# Patient Record
Sex: Male | Born: 1990 | Race: White | Hispanic: No | Marital: Single | Smoking: Current every day smoker
Health system: Southern US, Community
[De-identification: ages and names within clinical notes are randomized; demographics above are authoritative.]

## PROBLEM LIST (undated history)

## (undated) DIAGNOSIS — F909 Attention-deficit hyperactivity disorder, unspecified type: Secondary | ICD-10-CM

## (undated) DIAGNOSIS — R51 Headache: Secondary | ICD-10-CM

## (undated) DIAGNOSIS — B1920 Unspecified viral hepatitis C without hepatic coma: Secondary | ICD-10-CM

## (undated) DIAGNOSIS — T7840XA Allergy, unspecified, initial encounter: Secondary | ICD-10-CM

## (undated) DIAGNOSIS — F1910 Other psychoactive substance abuse, uncomplicated: Secondary | ICD-10-CM

## (undated) DIAGNOSIS — A4902 Methicillin resistant Staphylococcus aureus infection, unspecified site: Secondary | ICD-10-CM

## (undated) HISTORY — DX: Attention-deficit hyperactivity disorder, unspecified type: F90.9

## (undated) HISTORY — PX: KNEE ARTHROSCOPY: SUR90

## (undated) HISTORY — DX: Allergy, unspecified, initial encounter: T78.40XA

## (undated) HISTORY — PX: MYRINGOTOMY: SUR874

## (undated) HISTORY — DX: Other psychoactive substance abuse, uncomplicated: F19.10

---
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
## 2010-07-06 ENCOUNTER — Encounter: Payer: Self-pay | Admitting: General Surgery

## 2010-09-21 LAB — DIFFERENTIAL
Basophils Absolute: 0 10*3/uL (ref 0.0–0.1)
Basophils Relative: 0 % (ref 0–1)
Eosinophils Absolute: 0.1 10*3/uL (ref 0.0–0.7)
Eosinophils Relative: 1 % (ref 0–5)
Lymphocytes Relative: 32 % (ref 12–46)
Lymphs Abs: 3.1 10*3/uL (ref 0.7–4.0)
Monocytes Absolute: 0.6 10*3/uL (ref 0.1–1.0)
Monocytes Relative: 6 % (ref 3–12)
Neutro Abs: 5.9 10*3/uL (ref 1.7–7.7)
Neutrophils Relative %: 60 % (ref 43–77)

## 2010-09-21 LAB — CBC
HCT: 44.1 % (ref 39.0–52.0)
Hemoglobin: 14.8 g/dL (ref 13.0–17.0)
MCHC: 33.6 g/dL (ref 30.0–36.0)
MCV: 93.3 fL (ref 78.0–100.0)
Platelets: 188 10*3/uL (ref 150–400)
RBC: 4.73 MIL/uL (ref 4.22–5.81)
RDW: 11.9 % (ref 11.5–15.5)
WBC: 9.7 10*3/uL (ref 4.0–10.5)

## 2010-09-21 LAB — URINALYSIS, ROUTINE W REFLEX MICROSCOPIC
Bilirubin Urine: NEGATIVE
Glucose, UA: NEGATIVE mg/dL
Hgb urine dipstick: NEGATIVE
Ketones, ur: NEGATIVE mg/dL
Nitrite: NEGATIVE
Protein, ur: NEGATIVE mg/dL
Specific Gravity, Urine: 1.022 (ref 1.005–1.030)
Urobilinogen, UA: 1 mg/dL (ref 0.0–1.0)
pH: 6 (ref 5.0–8.0)

## 2010-09-21 LAB — BASIC METABOLIC PANEL
BUN: 12 mg/dL (ref 6–23)
CO2: 28 mEq/L (ref 19–32)
Calcium: 9.5 mg/dL (ref 8.4–10.5)
Chloride: 103 mEq/L (ref 96–112)
Creatinine, Ser: 0.83 mg/dL (ref 0.4–1.5)
GFR calc Af Amer: >60 mL/min (ref >60)
GFR calc non Af Amer: >60 mL/min (ref >60)
Glucose, Bld: 100 mg/dL — ABNORMAL HIGH (ref 70–99)
Potassium: 3.8 mEq/L (ref 3.5–5.1)
Sodium: 138 mEq/L (ref 135–145)

## 2010-09-21 LAB — RAPID URINE DRUG SCREEN, HOSP PERFORMED
Amphetamines: NOT DETECTED
Barbiturates: NOT DETECTED
Benzodiazepines: NOT DETECTED
Cocaine: NOT DETECTED
Opiates: NOT DETECTED
Tetrahydrocannabinol: POSITIVE — AB

## 2010-09-21 LAB — ETHANOL: Alcohol, Ethyl (B): <5 mg/dL (ref 0–10)

## 2010-10-28 NOTE — Consult Note (Signed)
NAME:  Bob Vargas, NDIAYE NO.:  [id number]   MEDICAL RECORD NO.:  [id number]          PATIENT TYPE:  IPS   LOCATION:  0502                          FACILITY:  BH   PHYSICIAN:  Acey Lav, MD  DATE OF BIRTH:  22-Aug-1990   DATE OF CONSULTATION:  01/06/2009  DATE OF DISCHARGE:                                 CONSULTATION   REASON FOR INFECTIOUS DISEASE CONSULT:  Concern for scabies.   HISTORY OF PRESENT ILLNESS:  Bob Vargas is an 20 year old Caucasian male  with a past medical history significant for polysubstance abuse  including intravenous heroin, methamphetamine, crack cocaine,  prescription opiates, marijuana, LSD, psychedelic mushrooms.  He was  evaluated in [facility] Room for medical clearance for  admission to psychiatry for detoxification from his Suboxone.  In the  emergency room he endorsed also pruritic lesions between his fingers,  along his waist, and ankles.  The patient was thought to have scabies,  and was given a dose of permethrin in the emergency room.  He was not  placed on contact precautions initially in the emergency room.  He was  given the permethrin lotion, which he applied to entire body, and then  12 hours later washed this off.  He then was brought into the [facility] approximately 17 hours after his initial application of  permethrin.  He is currently staying in a room with another patient in  Behavioral Health who is HIV positive and there has been concern about  the possibility of the patient transmitting scabies to his room mate or  other healthcare workers.   PAST MEDICAL HISTORY:  Polysubstance abuse, as described above.  Tobacco  abuse.   SURGICAL HISTORY:  None.   FAMILY HISTORY:  Noncontributory.   SOCIAL HISTORY:  Polysubstance abuse as described above.  He graduated  from high school.  Going to attend a technical college this fall.  He  has had 1 monogamous girlfriend.  He denies any  history of sexually  transmitted diseases.   REVIEW OF SYSTEMS:  As described in the history of present illness.  Otherwise, 10-point review of systems is negative.   CURRENT MEDICATIONS:  Nicotine.  Trazodone.  Tylenol.  Dulcolax.  Librium.  Glycerine suppository.  Maalox.  Cepacol.  Permethrin.   ALLERGIES:  No known drug allergies.   PHYSICAL EXAMINATION:  VITAL SIGNS:  Blood pressure 128/78, pulse 76,  pulse ox 95% on room air, T-max 98.1.  GENERAL:  Pleasant gentleman.  In no acute distress.  HEENT:  Normocephalic, atraumatic.  Pupils equal, round, and reactive to  light.  Sclerae anicteric.  Oropharynx clear.  CARDIOVASCULAR:  Regular rate and rhythm.  No murmurs, gallops, or rubs.  LUNGS:  Clear to auscultation bilaterally without wheezing or rales.  ABDOMEN:  Soft and nontender.  EXTREMITIES:  No edema.  SKIN:  The patient has multiple areas of excoriation along his inner  aspects of his fingers and the thenar aspect of his thumbs bilaterally.  He also has some nodules behind his olecranon on his left.  He has some  additional lesions to the stomach  and also on his feet.   LABORATORY DATA:  Metabolic panel, sodium 133, potassium 3.8, chloride  103, bicarb 28, BUN and creatinine 12.8 and 8.3.  Alcohol blood level  was less than 5.  CBC, differential, white count 9.7, hemoglobin 14.8,  platelets 188,000, ANC 5.9.  Drug screen positive for marijuana.  Urinalysis was negative.   IMPRESSION/RECOMMENDATIONS:  An 20 year old Caucasian gentleman with a  history of polysubstance abuse who has been admitted to behavioral  health for detoxification from Robaxin and treatment of his  polysubstance abuse, who has skin lesions concerning for scabies.   PROBLEM LIST:  1. He has the appearance consistent with classic scabies.  He has been      treated with permethrin 5%, which was washed off 12 hours after      initial application.  He needs to have a repeat dose of this in 1       week.  Note, his father and sister both have had pruritic lesions      as well and likely have infection with this as well, and they      should be treated with permethrin twice now and in a week as well.      All close physical contacts, sexual contacts should receive      treatment as well.  Bed clothing, clothes need to be washed with      hot water.  2. Infection prevention.  With regards to health workers and patients      who could have been in contact with this patient, and need for      prophylaxis.  Because this patient has a classical form of scabies,      health care workers or other patients who had prolonged physical      contact should get prophylaxis with permethrin versus ivermectin.      Simple contact, which is done during checking of vital signs or      physical examination should not put such individuals at high risk      for getting this form of scabies.  Certainly, if he had a crusted      form of scabies, it is much more contagious and even minimal skin      contact could transmit this disease.  I am going to find out what      kind of contact he has had, if any, with his room mate.  If he has,      then his room mate will need treatment with permethrin x1 now and      then in a week.  I do not think any of the health care workers from      the descriptions I am hearing will require prophylaxis with      permethrin.  In the future, patients like him should be placed on      contact precautions immediately, and contact precautions should be      continued for 24 hours after treatment was first initiated.  3. Health care screening.  I am going to check an HIV and viral      hepatitis panel on this patient given his history of intravenous      drug use.  Thank you for this infectious disease consultation and      please call with further questions.  Will follow up on his labs.      Acey Lav, MD  Electronically Signed     CV/MEDQ  D:  01/06/2009  T:   01/06/2009  Job:  [id number]

## 2010-10-31 NOTE — Discharge Summary (Signed)
NAME:  Bob Vargas, Bob Vargas          ACCOUNT NO.:  [id number]   MEDICAL RECORD NO.:  [id number]          PATIENT TYPE:  IPS   LOCATION:  0502                          FACILITY:  BH   PHYSICIAN:  Geoffery Lyons, M.D.      DATE OF BIRTH:  1990-11-16   DATE OF ADMISSION:  01/05/2009  DATE OF DISCHARGE:  01/09/2009                               DISCHARGE SUMMARY   CHIEF COMPLAINT:  This was the first admission to [facility] for this 20 year old white male with a past medical history  significant for polysubstance abuse including IV heroin,  methamphetamine, crack cocaine, prescription opiates, marijuana, LSD and  psychedelic mushrooms evaluated at the [facility] ED.  He was trying to  detox from his Suboxone.  In the ED endorsed pruritic lesions between  his fingers, around his waist and ankles, thought to have scabies.  He  was treated.  He was transferred to Behavior Health to continue  treatment for his substance abuse.   PAST PSYCHIATRIC HISTORY:  No current outpatient [name].   ALCOHOL AND DRUG HISTORY:  Polysubstance dependence on Suboxone but  coming off the Suboxone.   MEDICAL HISTORY:  Noncontributory.   MEDICATIONS:  Coming off Suboxone.   PHYSICAL EXAMINATION:  Failed to show any acute findings.   LABORATORY WORKUP:  Sodium 133, potassium 3.8, BUN 12.8, creatinine 8.3.  CBC - white blood cells 9.7, hemoglobin 14.8.  UDS positive for  marijuana.   MENTAL STATUS EXAM:  Upon admission revealed alert and cooperative male.  Mood anxious.  Affect anxious.  Not wanting to be in the hospital,  wanting to be discharged.  Endorsed that the family wants him to go into  a long-term program.  He was denying any active suicidal or homicidal  ideas.  No delusions.  No hallucinations.  Cognition well-preserved.   ADMITTING DIAGNOSES:  AXIS I:  Polysubstance dependence.  AXIS II:  No diagnosis.  AXIS III:  No diagnosis.  AXIS IV:  Moderate.  AXIS V:  Upon admission  35-40, highest GAF in the last year 70.   COURSE IN THE HOSPITAL:  He was admitted, started individual and group  psychotherapy.  As already stated this is an 20 year old male mostly  lately with opiate dependence on Suboxone maintenance.  He claimed he  was doing well but in the presence of charges new possession charges,  attempt to sell, parents wanted him to go to a long-term treatment  program for a year.  On Suboxone aches, pains, sweats.  We went ahead  and eased the detox with some Suboxone.  Claimed he started using  opiates after a knee surgery when he was 14.  Started using marijuana  when he was 13.  As already stated he went to court Wednesday and he has  not been able to get the charges taken care of.  He was on Suboxone 15  mg and he was being decreased for the last 2 months.  Racing thoughts,  worries.  Family history of addiction, diagnosed with ADHD.  On Adderall  XR 20 mg.  Initially somewhat resistant but eventually settled  down.  He  was open up to participate in treatment.  The family was working towards  finding what options they have.  There was a possibility of [facility] in [place] and the family was encouraged by the possibility  of him going there.  So by July 28 the placement was identified.  He was  willing to go and he had a better attitude about it so we went ahead and  discharged to rehab long-term.   DISCHARGE DIAGNOSES:  AXIS I:  Opiate dependence, polysubstance  dependence.  Attention deficit hyperactivity disorder.  AXIS II:  No diagnosis.  AXIS III:  No diagnosis.  AXIS IV:  Moderate.  AXIS V:  Upon discharge 50-55.   DISCHARGE MEDICATIONS:  Discharged on trazodone 300 mg at bedtime.   FOLLOWUP:  Follow up with [facility], [place].      Geoffery Lyons, M.D.  Electronically Signed     IL/MEDQ  D:  02/07/2009  T:  02/07/2009  Job:  [id number]

## 2012-04-08 ENCOUNTER — Encounter: Payer: Self-pay | Admitting: Internal Medicine

## 2013-10-06 ENCOUNTER — Encounter (HOSPITAL_BASED_OUTPATIENT_CLINIC_OR_DEPARTMENT_OTHER): Payer: 59 | Admitting: Anesthesiology

## 2013-10-06 ENCOUNTER — Ambulatory Visit (HOSPITAL_BASED_OUTPATIENT_CLINIC_OR_DEPARTMENT_OTHER): Payer: 59 | Admitting: Anesthesiology

## 2013-10-06 ENCOUNTER — Other Ambulatory Visit: Payer: Self-pay | Admitting: Orthopedic Surgery

## 2013-10-06 ENCOUNTER — Encounter (HOSPITAL_BASED_OUTPATIENT_CLINIC_OR_DEPARTMENT_OTHER): Payer: Self-pay | Admitting: *Deleted

## 2013-10-06 ENCOUNTER — Ambulatory Visit (HOSPITAL_BASED_OUTPATIENT_CLINIC_OR_DEPARTMENT_OTHER)
Admission: RE | Admit: 2013-10-06 | Discharge: 2013-10-06 | Disposition: A | Discharge status: Home / Self Care | Payer: 59 | Source: Ambulatory Visit | Attending: Orthopedic Surgery | Admitting: Orthopedic Surgery

## 2013-10-06 ENCOUNTER — Encounter (HOSPITAL_BASED_OUTPATIENT_CLINIC_OR_DEPARTMENT_OTHER)
Admission: RE | Disposition: A | Discharge status: Home / Self Care | Payer: Self-pay | Source: Ambulatory Visit | Attending: Orthopedic Surgery

## 2013-10-06 DIAGNOSIS — S60559A Superficial foreign body of unspecified hand, initial encounter: Secondary | ICD-10-CM

## 2013-10-06 DIAGNOSIS — M795 Residual foreign body in soft tissue: Secondary | ICD-10-CM | POA: Insufficient documentation

## 2013-10-06 DIAGNOSIS — Z1833 Retained wood fragments: Secondary | ICD-10-CM | POA: Insufficient documentation

## 2013-10-06 DIAGNOSIS — F909 Attention-deficit hyperactivity disorder, unspecified type: Secondary | ICD-10-CM | POA: Insufficient documentation

## 2013-10-06 DIAGNOSIS — R51 Headache: Secondary | ICD-10-CM | POA: Insufficient documentation

## 2013-10-06 DIAGNOSIS — F17200 Nicotine dependence, unspecified, uncomplicated: Secondary | ICD-10-CM | POA: Insufficient documentation

## 2013-10-06 DIAGNOSIS — Z8614 Personal history of Methicillin resistant Staphylococcus aureus infection: Secondary | ICD-10-CM | POA: Insufficient documentation

## 2013-10-06 HISTORY — DX: Methicillin resistant Staphylococcus aureus infection, unspecified site: A49.02

## 2013-10-06 HISTORY — DX: Headache: R51

## 2013-10-06 HISTORY — PX: FOREIGN BODY REMOVAL: SHX962

## 2013-10-06 LAB — POCT HEMOGLOBIN-HEMACUE: Hemoglobin: 16.6 g/dL (ref 13.0–17.0)

## 2013-10-06 SURGERY — FOREIGN BODY REMOVAL ADULT
Anesthesia: General | Site: Finger | Laterality: Right

## 2013-10-06 MED ORDER — BUPIVACAINE HCL (PF) 0.25 % IJ SOLN
INTRAMUSCULAR | Status: AC
  Filled 2013-10-06: qty 30

## 2013-10-06 MED ORDER — BUPIVACAINE HCL (PF) 0.25 % IJ SOLN
INTRAMUSCULAR | Status: DC | PRN
  Administered 2013-10-06: 3 mL

## 2013-10-06 MED ORDER — CHLORHEXIDINE GLUCONATE 4 % EX LIQD: 60.0000 mL | Freq: Once | CUTANEOUS | Status: DC

## 2013-10-06 MED ORDER — OXYCODONE HCL 5 MG/5ML PO SOLN: 5.0000 mg | Freq: Once | ORAL | Status: AC | PRN

## 2013-10-06 MED ORDER — HYDROMORPHONE HCL PF 1 MG/ML IJ SOLN
INTRAMUSCULAR | Status: AC
  Filled 2013-10-06: qty 1

## 2013-10-06 MED ORDER — OXYCODONE HCL 5 MG PO TABS
5.0000 mg | ORAL_TABLET | Freq: Once | ORAL | Status: AC | PRN
  Administered 2013-10-06: 5 mg via ORAL

## 2013-10-06 MED ORDER — LACTATED RINGERS IV SOLN
INTRAVENOUS | Status: DC
  Administered 2013-10-06 (×2): via INTRAVENOUS

## 2013-10-06 MED ORDER — MIDAZOLAM HCL 2 MG/2ML IJ SOLN: 1.0000 mg | INTRAMUSCULAR | Status: DC | PRN

## 2013-10-06 MED ORDER — HYDROMORPHONE HCL PF 1 MG/ML IJ SOLN
0.2500 mg | INTRAMUSCULAR | Status: DC | PRN
  Administered 2013-10-06 (×2): 0.5 mg via INTRAVENOUS

## 2013-10-06 MED ORDER — DIAZEPAM 5 MG/ML IJ SOLN
INTRAMUSCULAR | Status: AC
  Filled 2013-10-06: qty 2

## 2013-10-06 MED ORDER — CEFAZOLIN SODIUM-DEXTROSE 2-3 GM-% IV SOLR: 2.0000 g | INTRAVENOUS | Status: DC

## 2013-10-06 MED ORDER — DEXAMETHASONE SODIUM PHOSPHATE 4 MG/ML IJ SOLN
INTRAMUSCULAR | Status: DC | PRN
  Administered 2013-10-06: 10 mg via INTRAVENOUS

## 2013-10-06 MED ORDER — FENTANYL CITRATE 0.05 MG/ML IJ SOLN
INTRAMUSCULAR | Status: AC
  Filled 2013-10-06: qty 6

## 2013-10-06 MED ORDER — DIAZEPAM 5 MG/ML IJ SOLN
5.0000 mg | Freq: Three times a day (TID) | INTRAMUSCULAR | Status: DC | PRN
  Administered 2013-10-06: 5 mg via INTRAVENOUS

## 2013-10-06 MED ORDER — HYDROMORPHONE HCL PF 1 MG/ML IJ SOLN
0.2500 mg | INTRAMUSCULAR | Status: DC | PRN
  Administered 2013-10-06 (×4): 0.5 mg via INTRAVENOUS

## 2013-10-06 MED ORDER — PROPOFOL 10 MG/ML IV BOLUS
INTRAVENOUS | Status: DC | PRN
  Administered 2013-10-06: 200 mg via INTRAVENOUS

## 2013-10-06 MED ORDER — ONDANSETRON HCL 4 MG/2ML IJ SOLN
INTRAMUSCULAR | Status: DC | PRN
  Administered 2013-10-06: 4 mg via INTRAVENOUS

## 2013-10-06 MED ORDER — LIDOCAINE HCL (CARDIAC) 20 MG/ML IV SOLN
INTRAVENOUS | Status: DC | PRN
  Administered 2013-10-06: 80 mg via INTRAVENOUS

## 2013-10-06 MED ORDER — NALOXONE HCL 0.4 MG/ML IJ SOLN
INTRAMUSCULAR | Status: DC | PRN
  Administered 2013-10-06: .2 ug via INTRAVENOUS

## 2013-10-06 MED ORDER — MIDAZOLAM HCL 2 MG/2ML IJ SOLN
INTRAMUSCULAR | Status: AC
  Filled 2013-10-06: qty 4

## 2013-10-06 MED ORDER — PROMETHAZINE HCL 25 MG/ML IJ SOLN: 6.2500 mg | INTRAMUSCULAR | Status: DC | PRN

## 2013-10-06 MED ORDER — OXYCODONE HCL 5 MG PO TABS
ORAL_TABLET | ORAL | Status: AC
  Filled 2013-10-06: qty 1

## 2013-10-06 MED ORDER — FENTANYL CITRATE 0.05 MG/ML IJ SOLN: 50.0000 ug | INTRAMUSCULAR | Status: DC | PRN

## 2013-10-06 MED ORDER — CEFAZOLIN SODIUM-DEXTROSE 2-3 GM-% IV SOLR
INTRAVENOUS | Status: AC
  Filled 2013-10-06: qty 50

## 2013-10-06 MED ORDER — FENTANYL CITRATE 0.05 MG/ML IJ SOLN: 50.0000 ug | Freq: Once | INTRAMUSCULAR | Status: DC

## 2013-10-06 MED ORDER — MIDAZOLAM HCL 5 MG/5ML IJ SOLN
INTRAMUSCULAR | Status: DC | PRN
  Administered 2013-10-06: 2 mg via INTRAVENOUS

## 2013-10-06 MED ORDER — FENTANYL CITRATE 0.05 MG/ML IJ SOLN
INTRAMUSCULAR | Status: DC | PRN
  Administered 2013-10-06: 50 ug via INTRAVENOUS
  Administered 2013-10-06 (×2): 100 ug via INTRAVENOUS
  Administered 2013-10-06: 50 ug via INTRAVENOUS

## 2013-10-06 MED ORDER — OXYCODONE-ACETAMINOPHEN 5-325 MG PO TABS: 1.0000 | ORAL_TABLET | ORAL | Status: DC | PRN

## 2013-10-06 SURGICAL SUPPLY — 52 items
BANDAGE ELASTIC 3 VELCRO ST LF (GAUZE/BANDAGES/DRESSINGS) ×1 IMPLANT
BANDAGE ELASTIC 4 VELCRO ST LF (GAUZE/BANDAGES/DRESSINGS) IMPLANT
BLADE 15 SAFETY STRL DISP (BLADE) ×1 IMPLANT
BLADE SURG 15 STRL LF DISP TIS (BLADE) IMPLANT
BLADE SURG 15 STRL SS (BLADE) ×2
BNDG CMPR 9X4 STRL LF SNTH (GAUZE/BANDAGES/DRESSINGS) ×1
BNDG CMPR MD 5X2 ELC HKLP STRL (GAUZE/BANDAGES/DRESSINGS)
BNDG COHESIVE 1X5 TAN STRL LF (GAUZE/BANDAGES/DRESSINGS) ×1 IMPLANT
BNDG ELASTIC 2 VLCR STRL LF (GAUZE/BANDAGES/DRESSINGS) IMPLANT
BNDG ESMARK 4X9 LF (GAUZE/BANDAGES/DRESSINGS) ×1 IMPLANT
BNDG GAUZE ELAST 4 BULKY (GAUZE/BANDAGES/DRESSINGS) IMPLANT
CANISTER SUCT 1200ML W/VALVE (MISCELLANEOUS) IMPLANT
CORDS BIPOLAR (ELECTRODE) IMPLANT
COVER TABLE BACK 60X90 (DRAPES) ×2 IMPLANT
DECANTER SPIKE VIAL GLASS SM (MISCELLANEOUS) IMPLANT
DRAPE EXTREMITY T 121X128X90 (DRAPE) ×2 IMPLANT
DRAPE OEC MINIVIEW 54X84 (DRAPES) ×1 IMPLANT
DRAPE SURG 17X23 STRL (DRAPES) ×2 IMPLANT
DURAPREP 26ML APPLICATOR (WOUND CARE) ×2 IMPLANT
GAUZE SPONGE 4X4 16PLY XRAY LF (GAUZE/BANDAGES/DRESSINGS) IMPLANT
GAUZE XEROFORM 1X8 LF (GAUZE/BANDAGES/DRESSINGS) ×1 IMPLANT
GLOVE BIO SURGEON STRL SZ 6.5 (GLOVE) ×1 IMPLANT
GLOVE BIOGEL PI IND STRL 7.0 (GLOVE) IMPLANT
GLOVE BIOGEL PI INDICATOR 7.0 (GLOVE) ×1
GLOVE SURG SYN 8.0 (GLOVE) ×4 IMPLANT
GLOVE SURG SYN 8.0 PF PI (GLOVE) ×2 IMPLANT
GOWN STRL REUS W/TWL XL LVL3 (GOWN DISPOSABLE) ×2 IMPLANT
NDL HYPO 25X1 1.5 SAFETY (NEEDLE) IMPLANT
NEEDLE HYPO 25X1 1.5 SAFETY (NEEDLE) ×2 IMPLANT
NS IRRIG 1000ML POUR BTL (IV SOLUTION) ×1 IMPLANT
PACK BASIN DAY SURGERY FS (CUSTOM PROCEDURE TRAY) ×2 IMPLANT
PAD CAST 3X4 CTTN HI CHSV (CAST SUPPLIES) IMPLANT
PAD CAST 4YDX4 CTTN HI CHSV (CAST SUPPLIES) IMPLANT
PADDING CAST ABS 4INX4YD NS (CAST SUPPLIES) ×1
PADDING CAST ABS COTTON 4X4 ST (CAST SUPPLIES) ×1 IMPLANT
PADDING CAST COTTON 3X4 STRL (CAST SUPPLIES)
PADDING CAST COTTON 4X4 STRL (CAST SUPPLIES)
PADDING UNDERCAST 2 STRL (CAST SUPPLIES) ×1
PADDING UNDERCAST 2X4 STRL (CAST SUPPLIES) ×1 IMPLANT
SHEET MEDIUM DRAPE 40X70 STRL (DRAPES) ×2 IMPLANT
STOCKINETTE 4X48 STRL (DRAPES) ×2 IMPLANT
STRIP CLOSURE SKIN 1/2X4 (GAUZE/BANDAGES/DRESSINGS) IMPLANT
SUCTION FRAZIER TIP 10 FR DISP (SUCTIONS) IMPLANT
SUT ETHILON 5 0 P 3 18 (SUTURE)
SUT NYLON ETHILON 5-0 P-3 1X18 (SUTURE) IMPLANT
SUT VICRYL RAPID 5 0 P 3 (SUTURE) ×1 IMPLANT
SUT VICRYL RAPIDE 4-0 (SUTURE) IMPLANT
SYR BULB 3OZ (MISCELLANEOUS) ×1 IMPLANT
SYRINGE 10CC LL (SYRINGE) ×1 IMPLANT
TOWEL OR 17X24 6PK STRL BLUE (TOWEL DISPOSABLE) ×2 IMPLANT
TUBE CONNECTING 20X1/4 (TUBING) IMPLANT
UNDERPAD 30X30 INCONTINENT (UNDERPADS AND DIAPERS) ×2 IMPLANT

## 2013-10-06 NOTE — Op Note (Signed)
See note [id number]

## 2013-10-06 NOTE — H&P (Signed)
Bob SheffieldChristopher N Vargas is an 23 y.o. male.   Chief Complaint: right index pain HPI: as above with F.B. Distal aspect of right index finger  Past Medical History  Diagnosis Date  . ADHD (attention deficit hyperactivity disorder)   . Headache(784.0)   . MRSA infection (methicillin-resistant Staphylococcus aureus)     has had 3 times in past    Past Surgical History  Procedure Laterality Date  . Knee arthroscopy Right   . Myringotomy      History reviewed. No pertinent family history. Social History:  reports that he has been smoking.  He does not have any smokeless tobacco history on file. He reports that he drinks alcohol. He reports that he uses illicit drugs (Marijuana).  Allergies: No Known Allergies  Medications Prior to Admission  Medication Sig Dispense Refill  . amphetamine-dextroamphetamine (ADDERALL XR) 25 MG 24 hr capsule Take 25 mg by mouth every morning.        No results found for this or any previous visit (from the past 48 hour(s)). No results found.  Review of Systems  All other systems reviewed and are negative.   Blood pressure 110/69, pulse 72, temperature 98.7 F (37.1 C), temperature source Oral, resp. rate 16, SpO2 100.00%. Physical Exam  Constitutional: He is oriented to person, place, and time. He appears well-developed and well-nourished.  HENT:  Head: Normocephalic and atraumatic.  Cardiovascular: Normal rate.   Respiratory: Effort normal.  Musculoskeletal:       Right hand: He exhibits tenderness.  Right index distal tenderness and swelling with possible foreign body  Neurological: He is alert and oriented to person, place, and time.  Skin: Skin is warm.  Psychiatric: He has a normal mood and affect. His behavior is normal. Judgment and thought content normal.     Assessment/Plan As above   Plan I and D  Bob ShoresMatthew A Jalynne Vargas 10/06/2013, 1:45 PM

## 2013-10-06 NOTE — Anesthesia Procedure Notes (Signed)
Procedure Name: LMA Insertion Date/Time: 10/06/2013 3:03 PM Performed by: Genevieve NorlanderLINKA, Tailyn Hantz L Pre-anesthesia Checklist: Patient identified, Emergency Drugs available, Suction available, Patient being monitored and Timeout performed Patient Re-evaluated:Patient Re-evaluated prior to inductionOxygen Delivery Method: Circle System Utilized Preoxygenation: Pre-oxygenation with 100% oxygen Intubation Type: IV induction Ventilation: Mask ventilation without difficulty LMA: LMA inserted LMA Size: 5.0 Number of attempts: 1 Airway Equipment and Method: bite block Placement Confirmation: positive ETCO2 Tube secured with: Tape Dental Injury: Teeth and Oropharynx as per pre-operative assessment

## 2013-10-06 NOTE — Anesthesia Postprocedure Evaluation (Signed)
  Anesthesia Post-op Note  Patient: Bob SheffieldChristopher N Barreras  Procedure(s) Performed: Procedure(s): FOREIGN BODY REMOVAL ADULT right index finger (Right)  Patient Location: PACU  Anesthesia Type:General  Level of Consciousness: awake  Airway and Oxygen Therapy: Patient Spontanous Breathing  Post-op Pain: mild  Post-op Assessment: Post-op Vital signs reviewed, Patient's Cardiovascular Status Stable, Respiratory Function Stable, Patent Airway, No signs of Nausea or vomiting and Pain level controlled  Post-op Vital Signs: Reviewed and stable  Last Vitals:  Filed Vitals:   10/06/13 1625  BP: 125/70  Pulse: 72  Temp:   Resp: 20    Complications: No apparent anesthesia complications

## 2013-10-06 NOTE — Discharge Instructions (Signed)

## 2013-10-06 NOTE — Anesthesia Preprocedure Evaluation (Addendum)
Anesthesia Evaluation  Patient identified by MRN, date of birth, ID band Patient awake    Reviewed: Allergy & Precautions, H&P , NPO status , Patient's Chart, lab work & pertinent test results  Airway Mallampati: I TM Distance: >3 FB Neck ROM: Full    Dental   Pulmonary Current Smoker,  + rhonchi         Cardiovascular Rhythm:Regular Rate:Normal     Neuro/Psych  Headaches,    GI/Hepatic (+)     substance abuse  marijuana use,   Endo/Other    Renal/GU      Musculoskeletal   Abdominal   Peds  (+) ADHD Hematology   Anesthesia Other Findings   Reproductive/Obstetrics                          Anesthesia Physical Anesthesia Plan  ASA: II  Anesthesia Plan: General   Post-op Pain Management:    Induction: Intravenous  Airway Management Planned: LMA  Additional Equipment:   Intra-op Plan:   Post-operative Plan: Extubation in OR  Informed Consent: I have reviewed the patients History and Physical, chart, labs and discussed the procedure including the risks, benefits and alternatives for the proposed anesthesia with the patient or authorized representative who has indicated his/her understanding and acceptance.     Plan Discussed with: CRNA and Surgeon  Anesthesia Plan Comments:         Anesthesia Quick Evaluation

## 2013-10-06 NOTE — Transfer of Care (Signed)
Immediate Anesthesia Transfer of Care Note  Patient: Bob Vargas  Procedure(s) Performed: Procedure(s): FOREIGN BODY REMOVAL ADULT right index finger (Right)  Patient Location: PACU  Anesthesia Type:General  Level of Consciousness: awake, alert , oriented and patient cooperative  Airway & Oxygen Therapy: Patient Spontanous Breathing and Patient connected to face mask oxygen  Post-op Assessment: Report given to PACU RN and Post -op Vital signs reviewed and stable  Post vital signs: Reviewed and stable  Complications: No apparent anesthesia complications

## 2013-10-09 ENCOUNTER — Encounter (HOSPITAL_BASED_OUTPATIENT_CLINIC_OR_DEPARTMENT_OTHER): Payer: Self-pay | Admitting: Orthopedic Surgery

## 2013-10-09 NOTE — Op Note (Signed)
NAMWillis Vargas:  QUESH, Bayden           ACCOUNT NO.:  [id number]  MEDICAL RECORD NO.:  [id number]  LOCATION:                                 FACILITY:  PHYSICIAN:  Artist PaisMatthew A. Cross Jorge, M.D.DATE OF BIRTH:  Apr 18, 1991  DATE OF PROCEDURE:  10/06/2013 DATE OF DISCHARGE:  10/06/2013                              OPERATIVE REPORT   PREOPERATIVE DIAGNOSIS:  Retained foreign body, right index finger.  POSTOPERATIVE DIAGNOSIS:  Retained foreign body, right index finger.  PROCEDURE:  Incision and drainage with foreign body removal deep.  SURGEON:  Artist PaisMatthew A. Mina MarbleWeingold, M.D.  ASSISTANT:  None.  ANESTHESIA:  General.  COMPLICATIONS:  None.  DRAINS:  None.  PROCEDURE IN DETAIL:  The patient was taken operating suite.  After induction of general anesthesia, right upper extremity was prepped and draped in sterile fashion.  Esmarch was used to exsanguinate the limb. Tourniquet inflated to 250 mmHg.  At this point in time, incision was made.  The tip of the index finger on the right side, skin was incised for 1 cm connecting 2 small puncture wounds.  Dissection was carried down to the tip of the distal phalanx.  There was some wooden debris in the subcutaneous tissues.  This was debrided using a house curette and a rongeur.  All devitalized tissue was removed.  The wound was irrigated with 500 mL of normal saline and closed with 1 simple 4-0 Vicryl Rapide subcuticular stitch.  Xeroform, 4x4s, and compression Coban wrap was applied.  The patient tolerated the procedure well in concealed fashion.     Artist PaisMatthew A. Mina MarbleWeingold, M.D.   ______________________________ Artist PaisMatthew A. Mina MarbleWeingold, M.D.    MAW/MEDQ  D:  10/06/2013  T:  10/07/2013  Job:  [id number]

## 2015-06-25 DIAGNOSIS — Z1389 Encounter for screening for other disorder: Secondary | ICD-10-CM | POA: Diagnosis not present

## 2015-06-25 DIAGNOSIS — Z2821 Immunization not carried out because of patient refusal: Secondary | ICD-10-CM | POA: Diagnosis not present

## 2015-06-25 DIAGNOSIS — F17200 Nicotine dependence, unspecified, uncomplicated: Secondary | ICD-10-CM | POA: Diagnosis not present

## 2015-06-25 DIAGNOSIS — F909 Attention-deficit hyperactivity disorder, unspecified type: Secondary | ICD-10-CM | POA: Diagnosis not present

## 2015-07-06 DIAGNOSIS — S81001A Unspecified open wound, right knee, initial encounter: Secondary | ICD-10-CM | POA: Diagnosis not present

## 2015-07-06 DIAGNOSIS — R0789 Other chest pain: Secondary | ICD-10-CM | POA: Diagnosis not present

## 2015-07-06 DIAGNOSIS — Z2821 Immunization not carried out because of patient refusal: Secondary | ICD-10-CM | POA: Diagnosis not present

## 2015-07-06 DIAGNOSIS — M25561 Pain in right knee: Secondary | ICD-10-CM | POA: Diagnosis not present

## 2015-07-06 DIAGNOSIS — R0609 Other forms of dyspnea: Secondary | ICD-10-CM | POA: Diagnosis not present

## 2015-07-06 DIAGNOSIS — S76121A Laceration of right quadriceps muscle, fascia and tendon, initial encounter: Secondary | ICD-10-CM | POA: Diagnosis not present

## 2015-07-06 DIAGNOSIS — S71111A Laceration without foreign body, right thigh, initial encounter: Secondary | ICD-10-CM | POA: Diagnosis not present

## 2015-07-06 DIAGNOSIS — S81011A Laceration without foreign body, right knee, initial encounter: Secondary | ICD-10-CM | POA: Diagnosis not present

## 2015-07-06 HISTORY — PX: KNEE SURGERY: SHX244

## 2015-07-07 DIAGNOSIS — Z2821 Immunization not carried out because of patient refusal: Secondary | ICD-10-CM | POA: Diagnosis not present

## 2015-07-07 DIAGNOSIS — S76121A Laceration of right quadriceps muscle, fascia and tendon, initial encounter: Secondary | ICD-10-CM | POA: Diagnosis not present

## 2015-07-07 DIAGNOSIS — S81011A Laceration without foreign body, right knee, initial encounter: Secondary | ICD-10-CM | POA: Diagnosis not present

## 2015-07-07 DIAGNOSIS — R0789 Other chest pain: Secondary | ICD-10-CM | POA: Diagnosis not present

## 2015-07-09 ENCOUNTER — Other Ambulatory Visit: Payer: Self-pay | Admitting: Family Medicine

## 2015-07-09 ENCOUNTER — Ambulatory Visit (INDEPENDENT_AMBULATORY_CARE_PROVIDER_SITE_OTHER): Payer: 59 | Admitting: Family Medicine

## 2015-07-09 ENCOUNTER — Ambulatory Visit (INDEPENDENT_AMBULATORY_CARE_PROVIDER_SITE_OTHER): Payer: 59

## 2015-07-09 VITALS — BP 112/62 | HR 89 | Temp 97.6°F | Resp 18 | Ht 69.0 in | Wt 190.0 lb

## 2015-07-09 DIAGNOSIS — S8991XA Unspecified injury of right lower leg, initial encounter: Secondary | ICD-10-CM | POA: Diagnosis not present

## 2015-07-09 DIAGNOSIS — Z8614 Personal history of Methicillin resistant Staphylococcus aureus infection: Secondary | ICD-10-CM

## 2015-07-09 DIAGNOSIS — S8001XA Contusion of right knee, initial encounter: Secondary | ICD-10-CM

## 2015-07-09 DIAGNOSIS — Z4889 Encounter for other specified surgical aftercare: Secondary | ICD-10-CM | POA: Diagnosis not present

## 2015-07-09 DIAGNOSIS — R937 Abnormal findings on diagnostic imaging of other parts of musculoskeletal system: Secondary | ICD-10-CM

## 2015-07-09 DIAGNOSIS — M25461 Effusion, right knee: Secondary | ICD-10-CM

## 2015-07-09 DIAGNOSIS — M6281 Muscle weakness (generalized): Secondary | ICD-10-CM

## 2015-07-09 DIAGNOSIS — M7989 Other specified soft tissue disorders: Secondary | ICD-10-CM | POA: Diagnosis not present

## 2015-07-09 LAB — POCT CBC
Granulocyte percent: 54.7 %G (ref 37–80)
HCT, POC: 39.3 % — AB (ref 43.5–53.7)
Hemoglobin: 13.8 g/dL — AB (ref 14.1–18.1)
Lymph, poc: 3.3 (ref 0.6–3.4)
MCH, POC: 33.2 pg — AB (ref 27–31.2)
MCHC: 35 g/dL (ref 31.8–35.4)
MCV: 94.7 fL (ref 80–97)
MID (cbc): 0.8 (ref 0–0.9)
MPV: 7.5 fL (ref 0–99.8)
POC Granulocyte: 4.9 (ref 2–6.9)
POC LYMPH PERCENT: 36.6 %L (ref 10–50)
POC MID %: 8.7 %M (ref 0–12)
Platelet Count, POC: 168 10*3/uL (ref 142–424)
RBC: 4.15 M/uL — AB (ref 4.69–6.13)
RDW, POC: 12.2 %
WBC: 8.9 10*3/uL (ref 4.6–10.2)
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
## 2015-07-10 DIAGNOSIS — M25461 Effusion, right knee: Secondary | ICD-10-CM | POA: Diagnosis not present

## 2015-07-10 DIAGNOSIS — S8001XA Contusion of right knee, initial encounter: Secondary | ICD-10-CM | POA: Diagnosis not present

## 2015-07-10 DIAGNOSIS — S81011A Laceration without foreign body, right knee, initial encounter: Secondary | ICD-10-CM | POA: Diagnosis not present

## 2015-07-12 DIAGNOSIS — S81011D Laceration without foreign body, right knee, subsequent encounter: Secondary | ICD-10-CM | POA: Diagnosis not present

## 2015-07-18 DIAGNOSIS — S81011S Laceration without foreign body, right knee, sequela: Secondary | ICD-10-CM | POA: Diagnosis not present

## 2015-07-18 DIAGNOSIS — M25461 Effusion, right knee: Secondary | ICD-10-CM | POA: Diagnosis not present

## 2015-07-30 ENCOUNTER — Encounter: Payer: Self-pay | Admitting: Family Medicine

## 2015-07-30 ENCOUNTER — Ambulatory Visit (INDEPENDENT_AMBULATORY_CARE_PROVIDER_SITE_OTHER): Payer: 59 | Admitting: Family Medicine

## 2015-07-30 ENCOUNTER — Telehealth: Payer: Self-pay | Admitting: Family Medicine

## 2015-07-30 VITALS — BP 114/68 | HR 68 | Wt 183.0 lb

## 2015-07-30 DIAGNOSIS — Z7189 Other specified counseling: Secondary | ICD-10-CM | POA: Diagnosis not present

## 2015-07-30 DIAGNOSIS — N433 Hydrocele, unspecified: Secondary | ICD-10-CM | POA: Diagnosis not present

## 2015-07-30 DIAGNOSIS — Z720 Tobacco use: Secondary | ICD-10-CM

## 2015-07-30 DIAGNOSIS — S8991XA Unspecified injury of right lower leg, initial encounter: Secondary | ICD-10-CM

## 2015-07-30 DIAGNOSIS — Z7689 Persons encountering health services in other specified circumstances: Secondary | ICD-10-CM

## 2015-07-30 DIAGNOSIS — F17200 Nicotine dependence, unspecified, uncomplicated: Secondary | ICD-10-CM

## 2015-07-30 DIAGNOSIS — Z0000 Encounter for general adult medical examination without abnormal findings: Secondary | ICD-10-CM | POA: Diagnosis not present

## 2015-07-30 LAB — POCT URINALYSIS DIPSTICK
Bilirubin, UA: NEGATIVE
Blood, UA: NEGATIVE
Glucose, UA: NEGATIVE
Ketones, UA: NEGATIVE
Leukocytes, UA: NEGATIVE
Nitrite, UA: NEGATIVE
Protein, UA: NEGATIVE
Spec Grav, UA: >=1.03
Urobilinogen, UA: 4
pH, UA: 6

## 2015-07-30 NOTE — Telephone Encounter (Signed)
Pt's mother called and stated that he did have a tetanus injection while at hospital.

## 2015-07-30 NOTE — Telephone Encounter (Signed)
Left a message with pt's mother to call the office. We need to know if pt had a tetanus injection while he was in hospital.
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
## 2015-07-31 DIAGNOSIS — M25461 Effusion, right knee: Secondary | ICD-10-CM | POA: Diagnosis not present

## 2016-03-22 DIAGNOSIS — S42201A Unspecified fracture of upper end of right humerus, initial encounter for closed fracture: Secondary | ICD-10-CM | POA: Diagnosis not present

## 2016-03-23 DIAGNOSIS — S43101A Unspecified dislocation of right acromioclavicular joint, initial encounter: Secondary | ICD-10-CM | POA: Diagnosis not present

## 2016-03-23 DIAGNOSIS — S0990XA Unspecified injury of head, initial encounter: Secondary | ICD-10-CM | POA: Diagnosis not present

## 2016-03-23 DIAGNOSIS — S43004A Unspecified dislocation of right shoulder joint, initial encounter: Secondary | ICD-10-CM | POA: Diagnosis not present

## 2016-03-23 DIAGNOSIS — S0083XA Contusion of other part of head, initial encounter: Secondary | ICD-10-CM | POA: Diagnosis not present

## 2016-03-23 DIAGNOSIS — S299XXA Unspecified injury of thorax, initial encounter: Secondary | ICD-10-CM | POA: Diagnosis not present

## 2016-03-31 DIAGNOSIS — S0101XD Laceration without foreign body of scalp, subsequent encounter: Secondary | ICD-10-CM | POA: Diagnosis not present

## 2016-03-31 DIAGNOSIS — S43121D Dislocation of right acromioclavicular joint, 100%-200% displacement, subsequent encounter: Secondary | ICD-10-CM | POA: Diagnosis not present

## 2016-04-02 DIAGNOSIS — S0101XD Laceration without foreign body of scalp, subsequent encounter: Secondary | ICD-10-CM | POA: Diagnosis not present

## 2016-04-02 DIAGNOSIS — S43121D Dislocation of right acromioclavicular joint, 100%-200% displacement, subsequent encounter: Secondary | ICD-10-CM | POA: Diagnosis not present

## 2016-04-07 DIAGNOSIS — M25511 Pain in right shoulder: Secondary | ICD-10-CM | POA: Diagnosis not present

## 2016-04-07 DIAGNOSIS — S43101A Unspecified dislocation of right acromioclavicular joint, initial encounter: Secondary | ICD-10-CM | POA: Diagnosis not present

## 2016-04-07 DIAGNOSIS — S43004A Unspecified dislocation of right shoulder joint, initial encounter: Secondary | ICD-10-CM | POA: Diagnosis not present

## 2016-05-04 ENCOUNTER — Encounter (INDEPENDENT_AMBULATORY_CARE_PROVIDER_SITE_OTHER): Payer: Self-pay | Admitting: Orthopaedic Surgery

## 2016-05-04 ENCOUNTER — Ambulatory Visit (INDEPENDENT_AMBULATORY_CARE_PROVIDER_SITE_OTHER): Payer: 59 | Admitting: Orthopaedic Surgery

## 2016-05-04 ENCOUNTER — Ambulatory Visit (INDEPENDENT_AMBULATORY_CARE_PROVIDER_SITE_OTHER): Payer: Self-pay

## 2016-05-04 VITALS — BP 127/78 | HR 87 | Resp 12 | Ht 69.0 in | Wt 175.0 lb

## 2016-05-04 DIAGNOSIS — M25511 Pain in right shoulder: Secondary | ICD-10-CM | POA: Diagnosis not present
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
## 2016-05-06 ENCOUNTER — Ambulatory Visit (INDEPENDENT_AMBULATORY_CARE_PROVIDER_SITE_OTHER): Payer: 59 | Admitting: Orthopedic Surgery

## 2016-05-14 DIAGNOSIS — S43121D Dislocation of right acromioclavicular joint, 100%-200% displacement, subsequent encounter: Secondary | ICD-10-CM | POA: Diagnosis not present

## 2016-05-15 DIAGNOSIS — S8392XA Sprain of unspecified site of left knee, initial encounter: Secondary | ICD-10-CM | POA: Diagnosis not present

## 2016-05-16 DIAGNOSIS — S8392XA Sprain of unspecified site of left knee, initial encounter: Secondary | ICD-10-CM | POA: Diagnosis not present

## 2016-08-07 DIAGNOSIS — M25561 Pain in right knee: Secondary | ICD-10-CM | POA: Diagnosis not present

## 2016-08-12 DIAGNOSIS — M25561 Pain in right knee: Secondary | ICD-10-CM | POA: Diagnosis not present

## 2016-08-15 DIAGNOSIS — M25562 Pain in left knee: Secondary | ICD-10-CM | POA: Diagnosis not present

## 2016-08-20 DIAGNOSIS — M25569 Pain in unspecified knee: Secondary | ICD-10-CM | POA: Diagnosis not present

## 2016-08-20 DIAGNOSIS — R937 Abnormal findings on diagnostic imaging of other parts of musculoskeletal system: Secondary | ICD-10-CM | POA: Diagnosis not present

## 2016-08-27 DIAGNOSIS — Y9302 Activity, running: Secondary | ICD-10-CM | POA: Diagnosis not present

## 2016-08-27 DIAGNOSIS — S83412A Sprain of medial collateral ligament of left knee, initial encounter: Secondary | ICD-10-CM | POA: Diagnosis not present

## 2016-08-27 DIAGNOSIS — Y92480 Sidewalk as the place of occurrence of the external cause: Secondary | ICD-10-CM | POA: Diagnosis not present

## 2016-08-27 DIAGNOSIS — S83422A Sprain of lateral collateral ligament of left knee, initial encounter: Secondary | ICD-10-CM | POA: Diagnosis not present

## 2016-08-27 DIAGNOSIS — S83212A Bucket-handle tear of medial meniscus, current injury, left knee, initial encounter: Secondary | ICD-10-CM | POA: Diagnosis not present

## 2016-08-27 DIAGNOSIS — M25562 Pain in left knee: Secondary | ICD-10-CM | POA: Diagnosis not present

## 2016-09-04 DIAGNOSIS — S83242A Other tear of medial meniscus, current injury, left knee, initial encounter: Secondary | ICD-10-CM | POA: Diagnosis not present

## 2016-09-04 DIAGNOSIS — M94262 Chondromalacia, left knee: Secondary | ICD-10-CM | POA: Diagnosis not present

## 2016-09-04 DIAGNOSIS — G8918 Other acute postprocedural pain: Secondary | ICD-10-CM | POA: Diagnosis not present

## 2016-09-04 DIAGNOSIS — S83204A Other tear of unspecified meniscus, current injury, left knee, initial encounter: Secondary | ICD-10-CM | POA: Diagnosis not present

## 2016-09-04 DIAGNOSIS — M25562 Pain in left knee: Secondary | ICD-10-CM | POA: Diagnosis not present

## 2016-09-08 DIAGNOSIS — Z9889 Other specified postprocedural states: Secondary | ICD-10-CM | POA: Diagnosis not present

## 2016-09-08 DIAGNOSIS — M25562 Pain in left knee: Secondary | ICD-10-CM | POA: Diagnosis not present

## 2016-09-08 DIAGNOSIS — S83212S Bucket-handle tear of medial meniscus, current injury, left knee, sequela: Secondary | ICD-10-CM | POA: Diagnosis not present

## 2017-07-01 ENCOUNTER — Emergency Department (HOSPITAL_COMMUNITY): Payer: Worker's Compensation

## 2017-07-01 ENCOUNTER — Encounter (HOSPITAL_COMMUNITY): Payer: Self-pay | Admitting: *Deleted

## 2017-07-01 ENCOUNTER — Other Ambulatory Visit: Payer: Self-pay

## 2017-07-01 ENCOUNTER — Emergency Department (HOSPITAL_COMMUNITY)
Admission: EM | Admit: 2017-07-01 | Discharge: 2017-07-01 | Disposition: A | Discharge status: Home / Self Care | Payer: Worker's Compensation | Attending: Emergency Medicine | Admitting: Emergency Medicine

## 2017-07-01 DIAGNOSIS — Y929 Unspecified place or not applicable: Secondary | ICD-10-CM | POA: Insufficient documentation

## 2017-07-01 DIAGNOSIS — F17210 Nicotine dependence, cigarettes, uncomplicated: Secondary | ICD-10-CM | POA: Insufficient documentation

## 2017-07-01 DIAGNOSIS — K4090 Unilateral inguinal hernia, without obstruction or gangrene, not specified as recurrent: Secondary | ICD-10-CM

## 2017-07-01 DIAGNOSIS — Z23 Encounter for immunization: Secondary | ICD-10-CM | POA: Diagnosis not present

## 2017-07-01 DIAGNOSIS — Y999 Unspecified external cause status: Secondary | ICD-10-CM | POA: Diagnosis not present

## 2017-07-01 DIAGNOSIS — S61214A Laceration without foreign body of right ring finger without damage to nail, initial encounter: Secondary | ICD-10-CM | POA: Diagnosis not present

## 2017-07-01 DIAGNOSIS — Y939 Activity, unspecified: Secondary | ICD-10-CM | POA: Diagnosis not present

## 2017-07-01 DIAGNOSIS — Z79899 Other long term (current) drug therapy: Secondary | ICD-10-CM | POA: Insufficient documentation

## 2017-07-01 DIAGNOSIS — S61011A Laceration without foreign body of right thumb without damage to nail, initial encounter: Secondary | ICD-10-CM | POA: Diagnosis not present

## 2017-07-01 DIAGNOSIS — W268XXA Contact with other sharp object(s), not elsewhere classified, initial encounter: Secondary | ICD-10-CM | POA: Diagnosis not present

## 2017-07-01 DIAGNOSIS — S6991XA Unspecified injury of right wrist, hand and finger(s), initial encounter: Secondary | ICD-10-CM | POA: Diagnosis present

## 2017-07-01 DIAGNOSIS — S61209A Unspecified open wound of unspecified finger without damage to nail, initial encounter: Secondary | ICD-10-CM

## 2017-07-01 MED ORDER — LIDOCAINE-EPINEPHRINE-TETRACAINE (LET) SOLUTION
3.0000 mL | Freq: Once | NASAL | Status: AC
  Administered 2017-07-01: 3 mL via TOPICAL
  Filled 2017-07-01: qty 3

## 2017-07-01 MED ORDER — LIDOCAINE HCL (PF) 1 % IJ SOLN
10.0000 mL | Freq: Once | INTRAMUSCULAR | Status: AC
  Administered 2017-07-01: 10 mL via INTRADERMAL
  Filled 2017-07-01: qty 10

## 2017-07-01 MED ORDER — SULFAMETHOXAZOLE-TRIMETHOPRIM 800-160 MG PO TABS
1.0000 | ORAL_TABLET | Freq: Once | ORAL | Status: AC
  Administered 2017-07-01: 1 via ORAL
  Filled 2017-07-01: qty 1

## 2017-07-01 MED ORDER — TETANUS-DIPHTH-ACELL PERTUSSIS 5-2.5-18.5 LF-MCG/0.5 IM SUSP
0.5000 mL | Freq: Once | INTRAMUSCULAR | Status: AC
  Administered 2017-07-01: 0.5 mL via INTRAMUSCULAR
  Filled 2017-07-01: qty 0.5

## 2017-07-01 MED ORDER — SULFAMETHOXAZOLE-TRIMETHOPRIM 800-160 MG PO TABS: 2.0000 | ORAL_TABLET | Freq: Two times a day (BID) | ORAL | 0 refills | Status: DC

## 2017-07-01 NOTE — Discharge Instructions (Signed)
Do not apply any bacitracin or Neosporin or any other ointment to the tip of the finger that has the Dermabond in place, it will remove the glue.  Keep wound dry and do not remove dressing for 24 hours if possible. After that, wash gently morning and night (every 12 hours) with soap and water. Use a topical antibiotic ointment and cover with a bandaid or gauze.    Do NOT use rubbing alcohol or hydrogen peroxide, do not soak the area   Present to your primary care doctor or the urgent care of your choice, or the ED for suture removal in 10 days.   Every attempt was made to remove foreign body (contaminants) from the wound.  However, there is always a chance that some may remain in the wound. This can  increase your risk of infection.   If you see signs of infection (warmth, redness, tenderness, pus, sharp increase in pain, fever, red streaking in the skin) immediately return to the emergency department.   After the wound heals fully, apply sunscreen for 6-12 months to minimize scarring.

## 2017-07-01 NOTE — ED Provider Notes (Signed)
[facility] EMERGENCY DEPARTMENT Provider Note   CSN: [id number] Arrival date & time: 07/01/17  1213     History   Chief Complaint Chief Complaint  Patient presents with  . Extremity Laceration    HPI   Blood pressure 120/75, pulse 81, temperature 98.4 F (36.9 C), temperature source Oral, resp. rate (!) 22, height 5\' 10"  (1.778 m), weight 86.2 kg (190 lb), SpO2 98 %.  Bob Vargas is a 27 y.o. 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  Last tetanus shot is unknown, patient states that he feels very lightheaded.  He was sent from urgent care for evaluation.  Patient is left-hand dominant.  He denies any numbness or weakness.  He states he is sensitive to the site of blood.  Patient notes bubble in the right groin which she has whenever he coughs.  He denies fever, chills, nausea, vomiting, decreased defecation, he is passing flatus normally.  No pain in the area of the swelling.  Past Medical History:  Diagnosis Date  . ADHD (attention deficit hyperactivity disorder)   . Allergy   . Headache(784.0)   . MRSA infection (methicillin-resistant Staphylococcus aureus)    has had 3 times in past    Patient Active Problem List   Diagnosis Date Noted  . Current smoker 07/30/2015  . Hydrocele in adult 07/30/2015    Past Surgical History:  Procedure Laterality Date  . FOREIGN BODY REMOVAL Right 10/06/2013   Procedure: FOREIGN BODY REMOVAL ADULT right index finger;  Surgeon: Marlowe Shores, MD;  Location: [facility];  Service: Orthopedics;  Laterality: Right;  . KNEE ARTHROSCOPY Right   . KNEE SURGERY Right 07/06/2015  . MYRINGOTOMY         Home Medications    Prior to Admission medications   Medication Sig Start Date End Date Taking? Authorizing Provider  amphetamine-dextroamphetamine (ADDERALL XR) 25 MG 24 hr capsule Take 25  mg by mouth every morning. Reported on 07/09/2015    [provider]  sulfamethoxazole-trimethoprim (BACTRIM DS) 800-160 MG tablet Take 2 tablets by mouth 2 (two) times daily. 07/01/17   Cleland Simkins, Mardella Layman    Family History History reviewed. No pertinent family history.  Social History Social History   Tobacco Use  . Smoking status: Current Every Day Smoker    Packs/day: 0.25    Types: Cigarettes  . Smokeless tobacco: Never Used  Substance Use Topics  . Alcohol use: Yes    Alcohol/week: 8.4 oz    Types: 14 Cans of beer per week    Comment: 2-3 beers daily  . Drug use: Yes    Types: Marijuana    Comment: smoked marijuana last pm 10-06-13     Allergies   Patient has no known allergies.   Review of Systems Review of Systems  A complete review of systems was obtained and all systems are negative except as noted in the HPI and PMH.    Physical Exam Updated Vital Signs BP 111/84 (BP Location: Right Arm)   Pulse 76   Temp 98.4 F (36.9 C) (Oral)   Resp 16   Ht 5\' 10"  (1.778 m)   Wt 86.2 kg (190 lb)   SpO2 97%   BMI 27.26 kg/m   Physical Exam  Constitutional: He is oriented to person, place, and time. He appears well-developed and well-nourished. No distress.  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  Mouth/Throat: Oropharynx is clear and moist.  Eyes: Conjunctivae and EOM are normal. Pupils are equal, round, and reactive to light.  Neck: Normal range of motion.  Cardiovascular: Normal rate, regular rhythm and intact distal pulses.  Pulmonary/Chest: Effort normal and breath sounds normal.  Abdominal: Soft. There is no tenderness. A hernia is present.  Reducible right-sided inguinal hernia no significant tenderness  Musculoskeletal: Normal range of motion.       Hands: Neurological: He is alert and oriented  to person, place, and time.  Skin: He is not diaphoretic.  Psychiatric: He has a normal mood and affect.  Nursing note and vitals reviewed.    ED Treatments / Results  Labs (all labs ordered are listed, but only abnormal results are displayed) Labs Reviewed - No data to display  EKG  EKG Interpretation None       Radiology Dg Hand 2 View Right  Result Date: 07/01/2017 CLINICAL DATA:  The patient suffered a laceration of the right thumb today. Mechanism unknown. Initial encounter. EXAM: RIGHT HAND - 2 VIEW COMPARISON:  None. FINDINGS: No fracture, dislocation or radiopaque foreign body is identified. Bandaging about the right thumb is noted. IMPRESSION: Laceration without underlying fracture or foreign body. Electronically Signed   By: Drusilla Kanner M.D.   On: 07/01/2017 13:31    Procedures .Marland KitchenLaceration Repair Date/Time: 07/01/2017 2:51 PM Performed by: Wynetta Emery, PA-C Authorized by: Wynetta Emery, PA-C   Consent:    Consent obtained:  Verbal   Consent given by:  Patient   Risks discussed:  Infection and pain Anesthesia (see MAR for exact dosages):    Anesthesia method:  Topical application and local infiltration   Topical anesthetic:  LET   Local anesthetic:  Lidocaine 1% w/o epi (8cc) Laceration details:    Location:  Finger   Finger location:  R thumb   Length (cm):  3 Repair type:    Repair type:  Simple Pre-procedure details:    Preparation:  Patient was prepped and draped in usual sterile fashion Exploration:    Wound exploration: wound explored through full range of motion and entire depth of wound probed and visualized     Contaminated: no   Treatment:    Area cleansed with:  Saline   Amount of cleaning:  Extensive   Irrigation solution:  Sterile saline   Irrigation volume:  1 Liter   Irrigation method:  Pressure wash   Visualized foreign bodies/material removed: no   Skin repair:    Repair method:  Sutures   Suture size:  4-0   Wound  skin closure material used: Ethylon.   Suture technique:  Simple interrupted   Number of sutures:  6 Approximation:    Approximation:  Close   Vermilion border: well-aligned   Post-procedure details:    Dressing:  Antibiotic ointment   Patient tolerance of procedure:  Tolerated with difficulty .Marland KitchenLaceration Repair Date/Time: 07/01/2017 2:52 PM Performed by: Wynetta Emery, PA-C Authorized by: Wynetta Emery, PA-C   Consent:    Consent obtained:  Verbal   Consent given by:  Patient   Risks discussed:  Pain   Alternatives discussed:  No treatment, delayed treatment and observation Anesthesia (see MAR for exact dosages):    Anesthesia method:  Local infiltration   Local anesthetic:  Lidocaine 1% w/o epi (1) Treatment:    Amount  of cleaning:  Standard   Irrigation solution:  Sterile water   Irrigation method:  Syringe Skin repair:    Repair method:  Tissue adhesive Comments:     Nonviable skin flap to tip of right fourth digit excised, Dermabond applied after hemostasis was achieved with tourniquet.   (including critical care time)  Medications Ordered in ED Medications  lidocaine (PF) (XYLOCAINE) 1 % injection 10 mL (10 mLs Intradermal Given by Other 07/01/17 1315)  Tdap (BOOSTRIX) injection 0.5 mL (0.5 mLs Intramuscular Given 07/01/17 1313)  lidocaine-EPINEPHrine-tetracaine (LET) solution (3 mLs Topical Given 07/01/17 1313)  sulfamethoxazole-trimethoprim (BACTRIM DS,SEPTRA DS) 800-160 MG per tablet 1 tablet (1 tablet Oral Given 07/01/17 1431)     Initial Impression / Assessment and Plan / ED Course  I have reviewed the triage vital signs and the nursing notes.  Pertinent labs & imaging results that were available during my care of the patient were reviewed by me and considered in my medical decision making (see chart for details).     Vitals:   07/01/17 1240 07/01/17 1430  BP: 120/75 111/84  Pulse: 81 76  Resp: (!) 22 16  Temp: 98.4 F (36.9 C)   TempSrc: Oral     SpO2: 98% 97%  Weight: 86.2 kg (190 lb)   Height: 5\' 10"  (1.778 m)     Medications  lidocaine (PF) (XYLOCAINE) 1 % injection 10 mL (10 mLs Intradermal Given by Other 07/01/17 1315)  Tdap (BOOSTRIX) injection 0.5 mL (0.5 mLs Intramuscular Given 07/01/17 1313)  lidocaine-EPINEPHrine-tetracaine (LET) solution (3 mLs Topical Given 07/01/17 1313)  sulfamethoxazole-trimethoprim (BACTRIM DS,SEPTRA DS) 800-160 MG per tablet 1 tablet (1 tablet Oral Given 07/01/17 1431)    Venita SheffieldChristopher N Vivar is 27 y.o. male presenting with flap-like laceration to base of right thumb with no tendon or nerve involvement.  He has an avulsion to the tip of the right finger.  Patient is left-hand dominant.  After extensive discussion patient has acquiesced to wound closure.  Tetanus is updated, wound is cleaned and closed, we had an extensive discussion of wound care and return precautions.  Work and school note is provided.  Patient does have a reducible right-sided inguinal hernia, [facility] referral given.  We have had extensive discussion on return precautions based on incarceration or obstruction.  Patient verbalized understanding and teach back technique.  Evaluation does not show pathology that would require ongoing emergent intervention or inpatient treatment. Pt is hemodynamically stable and mentating appropriately. Discussed findings and plan with patient/guardian, who agrees with care plan. All questions answered. Return precautions discussed and outpatient follow up given.    Final Clinical Impressions(s) / ED Diagnoses   Final diagnoses:  Laceration of right thumb without foreign body without damage to nail, initial encounter  Avulsion of finger tip, initial encounter  Reducible right inguinal hernia    ED Discharge Orders        Ordered    sulfamethoxazole-trimethoprim (BACTRIM DS) 800-160 MG tablet  2 times daily     07/01/17 1427       Tammara Massing, Mardella Laymanicole, PA-C 07/01/17 1455    Azalia Bilisampos, Kevin,  MD 07/01/17 1651

## 2017-07-01 NOTE — ED Notes (Signed)
Nicole PA at bedside   

## 2017-07-01 NOTE — ED Notes (Signed)
Suture cart at bedside 

## 2017-07-01 NOTE — ED Triage Notes (Signed)
Pt in after laceration to his right thumb, went to urgent care and sent here for further evaluation, dressing place, bleeding controlled

## 2017-07-01 NOTE — ED Notes (Signed)
Patient transported to X-ray 

## 2017-07-01 NOTE — ED Notes (Signed)
Pt returned from xray

## 2017-08-07 ENCOUNTER — Encounter (HOSPITAL_COMMUNITY): Payer: Self-pay | Admitting: Nurse Practitioner

## 2017-08-07 ENCOUNTER — Emergency Department (HOSPITAL_COMMUNITY): Payer: Self-pay

## 2017-08-07 ENCOUNTER — Emergency Department (HOSPITAL_COMMUNITY)
Admission: EM | Admit: 2017-08-07 | Discharge: 2017-08-08 | Disposition: A | Discharge status: Home / Self Care | Payer: Self-pay | Attending: Emergency Medicine | Admitting: Emergency Medicine

## 2017-08-07 DIAGNOSIS — F17210 Nicotine dependence, cigarettes, uncomplicated: Secondary | ICD-10-CM | POA: Insufficient documentation

## 2017-08-07 DIAGNOSIS — L03113 Cellulitis of right upper limb: Secondary | ICD-10-CM | POA: Insufficient documentation

## 2017-08-07 DIAGNOSIS — M25531 Pain in right wrist: Secondary | ICD-10-CM

## 2017-08-07 DIAGNOSIS — F909 Attention-deficit hyperactivity disorder, unspecified type: Secondary | ICD-10-CM | POA: Insufficient documentation

## 2017-08-07 LAB — CBC WITH DIFFERENTIAL/PLATELET
Basophils Absolute: 0 10*3/uL (ref 0.0–0.1)
Basophils Relative: 0 %
Eosinophils Absolute: 0 10*3/uL (ref 0.0–0.7)
Eosinophils Relative: 0 %
HCT: 42.5 % (ref 39.0–52.0)
Hemoglobin: 15.1 g/dL (ref 13.0–17.0)
Lymphocytes Relative: 8 %
Lymphs Abs: 1.3 10*3/uL (ref 0.7–4.0)
MCH: 32.9 pg (ref 26.0–34.0)
MCHC: 35.5 g/dL (ref 30.0–36.0)
MCV: 92.6 fL (ref 78.0–100.0)
Monocytes Absolute: 0.7 10*3/uL (ref 0.1–1.0)
Monocytes Relative: 4 %
Neutro Abs: 14 10*3/uL — ABNORMAL HIGH (ref 1.7–7.7)
Neutrophils Relative %: 88 %
Platelets: 209 10*3/uL (ref 150–400)
RBC: 4.59 MIL/uL (ref 4.22–5.81)
RDW: 11.8 % (ref 11.5–15.5)
WBC: 16 10*3/uL — ABNORMAL HIGH (ref 4.0–10.5)

## 2017-08-07 MED ORDER — OXYCODONE-ACETAMINOPHEN 5-325 MG PO TABS
2.0000 | ORAL_TABLET | Freq: Once | ORAL | Status: AC
  Administered 2017-08-07: 2 via ORAL
  Filled 2017-08-07: qty 2

## 2017-08-07 MED ORDER — ONDANSETRON 4 MG PO TBDP
4.0000 mg | ORAL_TABLET | Freq: Once | ORAL | Status: AC
  Administered 2017-08-07: 4 mg via ORAL
  Filled 2017-08-07: qty 1

## 2017-08-07 NOTE — ED Triage Notes (Signed)
Pt is c/o severe wrist pain with associated numbness and decreased sensation. Denies injury or trauma to it.

## 2017-08-07 NOTE — ED Provider Notes (Signed)
[facility]-EMERGENCY DEPT Provider Note   CSN: [id number] Arrival date & time: 08/07/17  2033     History   Chief Complaint Chief Complaint  Patient presents with  . Wrist Pain    HPI Bob Vargas is a 27 y.o. male who presents the emergency department for complaint of right hand and wrist pain.  He has a past medical history of ADHD and admits to history of IV drug use.  Patient states that he was at work tonight when he manipulated and popped his right wrist.  He had sudden onset of severe pain.  He states that he is unable to move his fingers without severe pain complains of severe numbness and tingling.  He does admit to having numbness and tingling wake him from sleep over the past 3 nights.  He denies issues with the wrist or hand prior to that.  The patient does work as a Investment banker, operationalchef and is right-hand dominant and uses since hands and fingers frequently for work.    HPI  Past Medical History:  Diagnosis Date  . ADHD (attention deficit hyperactivity disorder)   . Allergy   . Headache(784.0)   . MRSA infection (methicillin-resistant Staphylococcus aureus)    has had 3 times in past    Patient Active Problem List   Diagnosis Date Noted  . Current smoker 07/30/2015  . Hydrocele in adult 07/30/2015    Past Surgical History:  Procedure Laterality Date  . FOREIGN BODY REMOVAL Right 10/06/2013   Procedure: FOREIGN BODY REMOVAL ADULT right index finger;  Surgeon: Marlowe ShoresMatthew A Weingold, MD;  Location: [facility];  Service: Orthopedics;  Laterality: Right;  . KNEE ARTHROSCOPY Right   . KNEE SURGERY Right 07/06/2015  . MYRINGOTOMY         Home Medications    Prior to Admission medications   Medication Sig Start Date End Date Taking? Authorizing Provider  cephALEXin (KEFLEX) 500 MG capsule 2 caps po bid x 7 days 08/08/17   Arthor CaptainHarris, Oslo Huntsman, PA-C  meloxicam (MOBIC) 15 MG tablet Take 1 tablet (15 mg total) by mouth daily. 08/08/17   Arthor CaptainHarris,  Basya Casavant, PA-C  sulfamethoxazole-trimethoprim (BACTRIM DS) 800-160 MG tablet Take 1 tablet by mouth 2 (two) times daily. 08/08/17   Arthor CaptainHarris, Othmar Ringer, PA-C    Family History History reviewed. No pertinent family history.  Social History Social History   Tobacco Use  . Smoking status: Current Every Day Smoker    Packs/day: 0.25    Types: Cigarettes  . Smokeless tobacco: Never Used  Substance Use Topics  . Alcohol use: Yes    Alcohol/week: 8.4 oz    Types: 14 Cans of beer per week    Comment: 2-3 beers daily  . Drug use: Yes    Types: Marijuana    Comment: smoked marijuana last pm 10-06-13     Allergies   Patient has no known allergies.   Review of Systems Review of Systems  Ten systems reviewed and are negative for acute change, except as noted in the HPI.   Physical Exam Updated Vital Signs BP (!) 162/98 (BP Location: Left Arm)   Pulse 80   Temp 98.2 F (36.8 C) (Oral)   Resp 14   Ht 5\' 9"  (1.753 m)   Wt 81.6 kg (180 lb)   SpO2 100%   BMI 26.58 kg/m   Physical Exam  Constitutional: He appears well-developed and well-nourished. No distress.  HENT:  Head: Normocephalic and atraumatic.  Eyes: Conjunctivae are  normal. No scleral icterus.  Neck: Normal range of motion. Neck supple.  Cardiovascular: Normal rate, regular rhythm and normal heart sounds.  Pulmonary/Chest: Effort normal and breath sounds normal. No respiratory distress.  Abdominal: Soft. There is no tenderness.  Musculoskeletal: Normal range of motion. He exhibits no edema.  Patient writhing in agony on the bed, holding his wrist, hand and a relaxed, claw position. Exquisitely ttp. Normal radial and brachial pulse .  Will not range the joint   Neurological: He is alert.  Skin: Skin is warm and dry. He is not diaphoretic.  Psychiatric: His behavior is normal.  Nursing note and vitals reviewed.    ED Treatments / Results  Labs (all labs ordered are listed, but only abnormal results are  displayed) Labs Reviewed  COMPREHENSIVE METABOLIC PANEL - Abnormal; Notable for the following components:      Result Value   Sodium 134 (*)    Potassium 5.7 (*)    CO2 19 (*)    Glucose, Bld 110 (*)    Creatinine, Ser 1.31 (*)    AST 42 (*)    Total Bilirubin 1.4 (*)    All other components within normal limits  CBC WITH DIFFERENTIAL/PLATELET - Abnormal; Notable for the following components:   WBC 16.0 (*)    Neutro Abs 14.0 (*)    All other components within normal limits  SEDIMENTATION RATE  C-REACTIVE PROTEIN    EKG  EKG Interpretation None       Radiology Dg Wrist Complete Right  Result Date: 08/07/2017 CLINICAL DATA:  Acute RIGHT wrist pain today. No known injury. Initial encounter. EXAM: RIGHT WRIST - COMPLETE 3+ VIEW COMPARISON:  07/01/2017 FINDINGS: There is no evidence of fracture or dislocation. There is no evidence of arthropathy or other focal bone abnormality. Soft tissues are unremarkable. IMPRESSION: Negative. Electronically Signed   By: Harmon Pier M.D.   On: 08/07/2017 21:45   Ct Angio Up Extrem Right W &/or Wo Contrast  Result Date: 08/08/2017 CLINICAL DATA:  History of IV drug abuse with severe wrist pain EXAM: CT ANGIOGRAPHY UPPER RIGHT EXTREMITY TECHNIQUE: CT angiography of the right upper extremity performed following intravenous contrast administration. Coronal and sagittal reformatted images are submitted. CONTRAST:  ISOVUE-370 IOPAMIDOL (ISOVUE-370) INJECTION 76% COMPARISON:  Radiograph 08/07/2017 FINDINGS: Vascular: Right subclavian and axillary vessels demonstrate normal enhancement with no stenosis or occlusion. Brachial artery demonstrates high bifurcation in the mid upper arm with superficial radial artery. The radial and ulnar arteries demonstrate normal flow enhancement and no evidence for occlusion. Vascular enhancement is seen to the level of the wrist/proximal hand. Nonvascular: No focal fluid collections within the soft tissues of the right  upper arm. Bones demonstrate no fracture or malalignment. No periostitis. Imaged right lung parenchyma is clear. No axillary adenopathy Review of the MIP images confirms the above findings. IMPRESSION: Vascular patency of the right upper extremity arterial vessels from the axilla to the wrist/proximal hand. Electronically Signed   By: Jasmine Pang M.D.   On: 08/08/2017 04:06    Procedures Procedures (including critical care time)  Medications Ordered in ED Medications  iopamidol (ISOVUE-370) 76 % injection (not administered)  oxyCODONE-acetaminophen (PERCOCET/ROXICET) 5-325 MG per tablet 2 tablet (2 tablets Oral Given 08/07/17 2251)  ondansetron (ZOFRAN-ODT) disintegrating tablet 4 mg (4 mg Oral Given 08/07/17 2251)  vancomycin (VANCOCIN) 2,000 mg in sodium chloride 0.9 % 500 mL IVPB (2,000 mg Intravenous New Bag/Given 08/08/17 0239)  sodium chloride 0.9 % bolus 1,000 mL (1,000 mLs  Intravenous New Bag/Given 08/08/17 0442)  fentaNYL (SUBLIMAZE) injection 50 mcg (50 mcg Intravenous Given 08/08/17 0240)  iopamidol (ISOVUE-370) 76 % injection 100 mL (100 mLs Intravenous Contrast Given 08/08/17 0307)  ketorolac (TORADOL) 30 MG/ML injection 30 mg (30 mg Intravenous Given 08/08/17 0442)     Initial Impression / Assessment and Plan / ED Course  I have reviewed the triage vital signs and the nursing notes.  Pertinent labs & imaging results that were available during my care of the patient were reviewed by me and considered in my medical decision making (see chart for details).  Clinical Course as of Aug 08 501  Sat Aug 07, 2017  4213 27 year old male left-hand dominant severe right wrist pain.  It started today with some numbness and tingling in his fingers and then he states he cracked it and there was severe pain.  He is currently in the bed writhing around the bed and holding his hand in neutral position.  He is got strong radial pulse and cap refill is unwilling to range of motion his digits.   Compartments all feel soft.  There is no stigmata of embolic phenomenon.  His x-ray showed no acute fracture.  I recommended getting some lab work to see if his inflammatory/infectious markers are elevated.  [MB]  Sun Aug 08, 2017  0018 WBC: (!) 16.0 [AH]  0125 Patient sed rate is normal. Sed Rate: 10 [AH]  0201 Spoke with Dr. Merlyn Lot about the patient's issues.  He does suggest we do a CT angios rule out any small and emboli that may be causing his pain.  [AH]  0414 Creatinine: (!) 1.31 [AH]  0502 CT scan shows no abnormalities of the vasculature, tissues or bony structures of the arm wrist or hand.  Patient will be treated with anti-inflammatory medications and immobilization.  I have discussed this with the patient.  This very likely carpal tunnel syndrome.  He is advised to follow-up with Dr. Merlyn Lot.  He appears appropriate for discharge at this time  [AH]    Clinical Course User Index [AH] Arthor Captain, PA-C [MB] Terrilee Files, MD      Final Clinical Impressions(s) / ED Diagnoses   Final diagnoses:  Right wrist pain  Cellulitis of right upper extremity    ED Discharge Orders        Ordered    sulfamethoxazole-trimethoprim (BACTRIM DS) 800-160 MG tablet  2 times daily     08/08/17 0418    cephALEXin (KEFLEX) 500 MG capsule     08/08/17 0418    meloxicam (MOBIC) 15 MG tablet  Daily     08/08/17 0418       Arthor Captain, PA-C 08/08/17 0503    Terrilee Files, MD 08/09/17 [phone]

## 2017-08-07 NOTE — ED Notes (Signed)
Ortho en route.  

## 2017-08-08 ENCOUNTER — Encounter (HOSPITAL_COMMUNITY): Payer: Self-pay | Admitting: Radiology

## 2017-08-08 ENCOUNTER — Emergency Department (HOSPITAL_COMMUNITY): Payer: Self-pay

## 2017-08-08 LAB — COMPREHENSIVE METABOLIC PANEL
ALT: 23 U/L (ref 17–63)
AST: 42 U/L — ABNORMAL HIGH (ref 15–41)
Albumin: 4.7 g/dL (ref 3.5–5.0)
Alkaline Phosphatase: 68 U/L (ref 38–126)
Anion gap: 14 (ref 5–15)
BUN: 15 mg/dL (ref 6–20)
CO2: 19 mmol/L — ABNORMAL LOW (ref 22–32)
Calcium: 9 mg/dL (ref 8.9–10.3)
Chloride: 101 mmol/L (ref 101–111)
Creatinine, Ser: 1.31 mg/dL — ABNORMAL HIGH (ref 0.61–1.24)
GFR calc Af Amer: >60 mL/min (ref >60)
GFR calc non Af Amer: >60 mL/min (ref >60)
Glucose, Bld: 110 mg/dL — ABNORMAL HIGH (ref 65–99)
Potassium: 5.7 mmol/L — ABNORMAL HIGH (ref 3.5–5.1)
Sodium: 134 mmol/L — ABNORMAL LOW (ref 135–145)
Total Bilirubin: 1.4 mg/dL — ABNORMAL HIGH (ref 0.3–1.2)
Total Protein: 7.3 g/dL (ref 6.5–8.1)

## 2017-08-08 LAB — SEDIMENTATION RATE: Sed Rate: 10 mm/hr (ref 0–16)

## 2017-08-08 LAB — C-REACTIVE PROTEIN: CRP: <0.8 mg/dL (ref <1.0)

## 2017-08-08 MED ORDER — VANCOMYCIN HCL 10 G IV SOLR
2000.0000 mg | Freq: Once | INTRAVENOUS | Status: AC
  Administered 2017-08-08: 2000 mg via INTRAVENOUS
  Filled 2017-08-08: qty 2000

## 2017-08-08 MED ORDER — MELOXICAM 15 MG PO TABS: 15.0000 mg | ORAL_TABLET | Freq: Every day | ORAL | 0 refills | Status: DC

## 2017-08-08 MED ORDER — SULFAMETHOXAZOLE-TRIMETHOPRIM 800-160 MG PO TABS: 1.0000 | ORAL_TABLET | Freq: Two times a day (BID) | ORAL | 0 refills | Status: DC

## 2017-08-08 MED ORDER — KETOROLAC TROMETHAMINE 30 MG/ML IJ SOLN
30.0000 mg | Freq: Once | INTRAMUSCULAR | Status: AC
  Administered 2017-08-08: 30 mg via INTRAVENOUS
  Filled 2017-08-08: qty 1

## 2017-08-08 MED ORDER — FENTANYL CITRATE (PF) 100 MCG/2ML IJ SOLN
50.0000 ug | Freq: Once | INTRAMUSCULAR | Status: AC
  Administered 2017-08-08: 50 ug via INTRAVENOUS
  Filled 2017-08-08: qty 2

## 2017-08-08 MED ORDER — IOPAMIDOL (ISOVUE-370) INJECTION 76%
100.0000 mL | Freq: Once | INTRAVENOUS | Status: AC | PRN
  Administered 2017-08-08: 100 mL via INTRAVENOUS

## 2017-08-08 MED ORDER — IOPAMIDOL (ISOVUE-370) INJECTION 76%
INTRAVENOUS | Status: AC
  Filled 2017-08-08: qty 100

## 2017-08-08 MED ORDER — SODIUM CHLORIDE 0.9 % IV BOLUS (SEPSIS)
1000.0000 mL | Freq: Once | INTRAVENOUS | Status: AC
  Administered 2017-08-08: 1000 mL via INTRAVENOUS

## 2017-08-08 MED ORDER — CEPHALEXIN 500 MG PO CAPS: ORAL_CAPSULE | ORAL | 0 refills | Status: DC

## 2017-08-08 NOTE — Progress Notes (Signed)
A consult was received from an ED physician for vancomycin per pharmacy dosing.  The patient's profile has been reviewed for ht/wt/allergies/indication/available labs.   A one time order has been placed for Vancomycin 2 Gm.  Further antibiotics/pharmacy consults should be ordered by admitting physician if indicated.                       Thank you, Lorenza EvangelistGreen, Jnya Brossard R 08/08/2017  1:34 AM

## 2017-08-08 NOTE — Discharge Instructions (Signed)
Get help right away if: Your symptoms get worse. You feel very sleepy. You develop vomiting or diarrhea that persists. You notice red streaks coming from the infected area. Your red area gets larger or turns dark in color. 

## 2018-03-01 ENCOUNTER — Ambulatory Visit: Payer: Self-pay | Admitting: Family Medicine

## 2019-02-16 ENCOUNTER — Encounter (HOSPITAL_COMMUNITY): Payer: Self-pay | Admitting: Emergency Medicine

## 2019-02-16 ENCOUNTER — Other Ambulatory Visit: Payer: Self-pay

## 2019-02-16 ENCOUNTER — Ambulatory Visit (HOSPITAL_COMMUNITY)
Admission: EM | Admit: 2019-02-16 | Discharge: 2019-02-16 | Disposition: A | Discharge status: Home / Self Care | Payer: Self-pay | Attending: Family Medicine | Admitting: Family Medicine

## 2019-02-16 DIAGNOSIS — L03113 Cellulitis of right upper limb: Secondary | ICD-10-CM

## 2019-02-16 MED ORDER — DOXYCYCLINE HYCLATE 100 MG PO CAPS: 100.0000 mg | ORAL_CAPSULE | Freq: Two times a day (BID) | ORAL | 0 refills | Status: DC

## 2019-02-16 NOTE — ED Triage Notes (Signed)
Pt sts wound check to right hand with blister noted; pt sts was grinding and got burn to area and unsure if could have metal present

## 2019-02-21 NOTE — ED Provider Notes (Signed)
[place]   [id number] 02/16/19 Arrival Time: 0347  ASSESSMENT & PLAN:  1. Cellulitis of right upper extremity     Declines imaging of hand to evaluation for metal foreign body. No indication for I&D attempt at this time.  To begin: Meds ordered this encounter  Medications  . doxycycline (VIBRAMYCIN) 100 MG capsule    Sig: Take 1 capsule (100 mg total) by mouth 2 (two) times daily.    Dispense:  20 capsule    Refill:  0   OTC analgesics if needed.  Recommend: Follow-up Information    Roseanne Kaufman, MD.   Specialty: Orthopedic Surgery Why: If worsening or failing to improve as anticipated. Contact information: [address] [place] [address] [phone]           Reviewed expectations re: course of current medical issues. Questions answered. Outlined signs and symptoms indicating need for more acute intervention. Patient verbalized understanding. After Visit Summary given.  SUBJECTIVE: History from: patient. Bob Vargas is a 28 y.o. male who reports persistent mild swelling and redness of his R hand between thumb and second finger. Questions related to metal grinding. "Maybe something got under the skin or something"; questions metal shaving vs skin; a few days ago. Gradual increase in erythema. No active drainage or bleeding. No specific aggravating or alleviating factors reported. No extremity sensation changes or weakness. Mild to moderate associated pain at area on R hand. Reports no wrist or finger ROM loss. Self treatment: has not tried OTCs for relief of pain. Has been washing area regularly.  Td is UTD; 2019.  Immunization History  Administered Date(s) Administered  . DTaP 11/14/1990, 01/13/1991, 03/16/1991, 12/14/1991, 09/24/1995  . Hepatitis B 12/22/2001, 03/05/2002, 07/06/2002  . HiB (PRP-OMP) 11/14/1990, 01/13/1991, 03/16/1991, 12/14/1991  . IPV 11/14/1990, 01/13/1991, 12/14/1991, 09/24/1995  . MMR  12/14/1991, 09/24/1995  . Td 12/22/2001  . Tdap 07/09/2015, 07/01/2017    Past Surgical History:  Procedure Laterality Date  . FOREIGN BODY REMOVAL Right 10/06/2013   Procedure: FOREIGN BODY REMOVAL ADULT right index finger;  Surgeon: Schuyler Amor, MD;  Location: [facility];  Service: Orthopedics;  Laterality: Right;  . KNEE ARTHROSCOPY Right   . KNEE SURGERY Right 07/06/2015  . MYRINGOTOMY       ROS: As per HPI. All other systems negative.    OBJECTIVE:  Vitals:   02/16/19 1910  BP: 135/88  Pulse: 79  Resp: 18  Temp: 98.5 F (36.9 C)  TempSrc: Oral  SpO2: 98%    General appearance: alert; no distress HEENT: [place]; AT Neck: supple with FROM Resp: unlabored respirations Extremities: . RUE: warm and well perfused; well localized moderate tenderness over right hand between thumb and second finger where there is an approx 1.5 cm area of erythema with central blister; area is warm to touch; without significant swelling; without bruising; ROM: normal at wrist and of all fingers without reported discomfort CV: brisk extremity capillary refill of RUE; 2+ radial pulse of RUE. Skin: warm and dry; no visible rashes Neurologic: normal reflexes of RUE; normal sensation of RUE; normal strength of RUE Psychological: alert and cooperative; normal mood and affect  No Known Allergies  Past Medical History:  Diagnosis Date  . ADHD (attention deficit hyperactivity disorder)   . Allergy   . Headache(784.0)   . MRSA infection (methicillin-resistant Staphylococcus aureus)    has had 3 times in past   Social History   Socioeconomic History  . Marital status: Single  Spouse name: Not on file  . Number of children: Not on file  . Years of education: Not on file  . Highest education level: Not on file  Occupational History  . Not on file  Social Needs  . Financial resource strain: Not on file  . Food insecurity    Worry: Not on file    Inability: Not on file   . Transportation needs    Medical: Not on file    Non-medical: Not on file  Tobacco Use  . Smoking status: Current Every Day Smoker    Packs/day: 0.25    Types: Cigarettes  . Smokeless tobacco: Never Used  Substance and Sexual Activity  . Alcohol use: Yes    Alcohol/week: 14.0 standard drinks    Types: 14 Cans of beer per week    Comment: 2-3 beers daily  . Drug use: Yes    Types: Marijuana    Comment: smoked marijuana last pm 10-06-13  . Sexual activity: Not on file  Lifestyle  . Physical activity    Days per week: Not on file    Minutes per session: Not on file  . Stress: Not on file  Relationships  . Social Herbalist on phone: Not on file    Gets together: Not on file    Attends religious service: Not on file    Active member of club or organization: Not on file    Attends meetings of clubs or organizations: Not on file    Relationship status: Not on file  Other Topics Concern  . Not on file  Social History Narrative  . Not on file   FH: Question of HTN.  Past Surgical History:  Procedure Laterality Date  . FOREIGN BODY REMOVAL Right 10/06/2013   Procedure: FOREIGN BODY REMOVAL ADULT right index finger;  Surgeon: Schuyler Amor, MD;  Location: [facility];  Service: Orthopedics;  Laterality: Right;  . KNEE ARTHROSCOPY Right   . KNEE SURGERY Right 07/06/2015  . Fransisco Beau, MD 02/21/19 [phone]

## 2019-03-19 ENCOUNTER — Encounter (HOSPITAL_COMMUNITY): Payer: Self-pay

## 2019-03-19 ENCOUNTER — Other Ambulatory Visit: Payer: Self-pay

## 2019-03-19 ENCOUNTER — Ambulatory Visit (HOSPITAL_COMMUNITY)
Admission: EM | Admit: 2019-03-19 | Discharge: 2019-03-19 | Disposition: A | Discharge status: Home / Self Care | Payer: Self-pay | Attending: Family Medicine | Admitting: Family Medicine

## 2019-03-19 DIAGNOSIS — L03115 Cellulitis of right lower limb: Secondary | ICD-10-CM

## 2019-03-19 DIAGNOSIS — L03317 Cellulitis of buttock: Secondary | ICD-10-CM

## 2019-03-19 HISTORY — DX: Unspecified viral hepatitis C without hepatic coma: B19.20

## 2019-03-19 MED ORDER — CLINDAMYCIN HCL 300 MG PO CAPS: 300.0000 mg | ORAL_CAPSULE | Freq: Three times a day (TID) | ORAL | 0 refills | Status: DC

## 2019-03-19 MED ORDER — CEFTRIAXONE SODIUM 1 G IJ SOLR
0.5000 g | Freq: Once | INTRAMUSCULAR | Status: AC
  Administered 2019-03-19: 0.5 g via INTRAMUSCULAR

## 2019-03-19 MED ORDER — CEFTRIAXONE SODIUM 250 MG IJ SOLR
INTRAMUSCULAR | Status: AC
  Filled 2019-03-19: qty 500

## 2019-03-19 MED ORDER — CLINDAMYCIN HCL 300 MG PO CAPS: 300.0000 mg | ORAL_CAPSULE | Freq: Three times a day (TID) | ORAL | 0 refills | Status: AC

## 2019-03-19 NOTE — ED Provider Notes (Signed)
[facility]    CSN: [id number] Arrival date & time: 03/19/19  1426      History   Chief Complaint Chief Complaint  Patient presents with  . Wound Infection    HPI Bob Vargas is a 28 y.o. male.   HPI  Skin infection developed over 1 week. Patient noticed a "bump" on inner left thigh yesterday and popped it and large amount of pus was expressed from the site. Over the course of day the leg has developed worsening erythema that is extending down left thigh. Endorse pain and redness in his scrotal area extending into his rectal area. Patient denies knowledge of precipitating events that cause infection develop.  Patient has hepatitis C infection and history of MRSA . He is uninsured and reports he has been unable to follow-up infectious disease. Denies fever. Endorse chills. No N&V.  Past Medical History:  Diagnosis Date  . ADHD (attention deficit hyperactivity disorder)   . Allergy   . Headache(784.0)   . Hepatitis C   . MRSA infection (methicillin-resistant Staphylococcus aureus)    has had 3 times in past    Patient Active Problem List   Diagnosis Date Noted  . Current smoker 07/30/2015  . Hydrocele in adult 07/30/2015    Past Surgical History:  Procedure Laterality Date  . FOREIGN BODY REMOVAL Right 10/06/2013   Procedure: FOREIGN BODY REMOVAL ADULT right index finger;  Surgeon: Marlowe Shores, MD;  Location: [facility];  Service: Orthopedics;  Laterality: Right;  . KNEE ARTHROSCOPY Right   . KNEE SURGERY Right 07/06/2015  . MYRINGOTOMY         Home Medications    Prior to Admission medications   Medication Sig Start Date End Date Taking? Authorizing Provider  doxycycline (VIBRAMYCIN) 100 MG capsule Take 1 capsule (100 mg total) by mouth 2 (two) times daily. 02/16/19   Mardella Layman, MD    Family History History reviewed. No pertinent family history.  Social History Social History   Tobacco Use  . Smoking status:  Current Every Day Smoker    Packs/day: 0.25    Types: Cigarettes  . Smokeless tobacco: Never Used  Substance Use Topics  . Alcohol use: Yes    Alcohol/week: 14.0 standard drinks    Types: 14 Cans of beer per week    Comment: 2-3 beers daily  . Drug use: Yes    Types: Marijuana    Comment: smoked marijuana last pm 10-06-13     Allergies   Patient has no known allergies.   Review of Systems Review of Systems Pertinent negatives listed in HPI Physical Exam Triage Vital Signs ED Triage Vitals  Enc Vitals Group     BP 03/19/19 1450 135/86     Pulse Rate 03/19/19 1450 (!) 106     Resp 03/19/19 1450 18     Temp 03/19/19 1450 98.1 F (36.7 C)     Temp Source 03/19/19 1450 Temporal     SpO2 03/19/19 1450 100 %     Weight 03/19/19 1452 200 lb (90.7 kg)     Height --      Head Circumference --      Peak Flow --      Pain Score 03/19/19 1452 10     Pain Loc --      Pain Edu? --      Excl. in GC? --    No data found.  Updated Vital Signs BP 135/86 (BP Location: Right Arm)  Pulse (!) 106   Temp 98.1 F (36.7 C) (Temporal)   Resp 18   Wt 200 lb (90.7 kg)   SpO2 100%   BMI 29.53 kg/m   Visual Acuity Right Eye Distance:   Left Eye Distance:   Bilateral Distance:    Right Eye Near:   Left Eye Near:    Bilateral Near:     Physical Exam General appearance: alert, well developed, well nourished, cooperative and in no distress Head: Normocephalic, without obvious abnormality, atraumatic Respiratory: Respirations even and unlabored, normal respiratory rate Heart: Tachycardia. No gallop or murmurs noted on exam  Extremities: Left thigh erythema open drained papule, increases warmth marked wit indelible m arker  Skin: Scrotal and perineum: erythematous and warm, tender. No distinct lesion noted on exam. Edema (non pitting perineum)  Psych: Appropriate mood and affect. Neurologic: Mental status: Alert, oriented to person, place, and time, thought content appropriate.   UC Treatments / Results  Labs (all labs ordered are listed, but only abnormal results are displayed) Labs Reviewed - No data to display  EKG   Radiology No results found.  Procedures Procedures (including critical care time)  Medications Ordered in UC Medications - No data to display  Initial Impression / Assessment and Plan / UC Course  I have reviewed the triage vital signs and the nursing notes.  Pertinent labs & imaging results that were available during my care of the patient were reviewed by me and considered in my medical decision making (see chart for details).   Strict return precautions. Given patient's history of chronic skin infection, high risk for treatment failure. Pt has limited income and was recently treatment with Doxycyline . Will start clarithromycin TID x 14 days.  Patient is to return to UC in 3 days for wound check to ensure infection is resolving.  Final Clinical Impressions(s) / UC Diagnoses   Final diagnoses:  Cellulitis of buttock  Cellulitis of right lower extremity     Discharge Instructions     Return for follow-up in 3 days for evaluation of skin infection. Start antibiotic therapy today. Take medication as prescribed x 14 days. Take antiinflammatory Ibuprofen as needed for pain.    ED Prescriptions    Medication Sig Dispense Auth. Provider   clindamycin (CLEOCIN) 300 MG capsule  (Status: Discontinued) Take 1 capsule (300 mg total) by mouth 3 (three) times daily for 20 days. 60 capsule Scot Jun, FNP   clindamycin (CLEOCIN) 300 MG capsule Take 1 capsule (300 mg total) by mouth 3 (three) times daily for 14 days. 42 capsule Scot Jun, FNP     PDMP not reviewed this encounter.   Scot Jun, [place] 03/20/19 2238

## 2019-03-19 NOTE — ED Triage Notes (Signed)
Pt states he has a wound on her buttock and his left thigh. X 1 week or more. Pt states he thinks it's MRSA.

## 2019-03-19 NOTE — Discharge Instructions (Signed)
Return for follow-up in 3 days for evaluation of skin infection. Start antibiotic therapy today. Take medication as prescribed x 14 days. Take antiinflammatory Ibuprofen as needed for pain.

## 2019-06-06 IMAGING — DX DG HAND 2V*R*
2 series · 2 of 2 positions shown · non-contrast
Comparison: None.

CLINICAL DATA: The patient suffered a laceration of the right thumb
today. Mechanism unknown. Initial encounter.

EXAM:
RIGHT HAND - 2 VIEW

[x hand pa right]
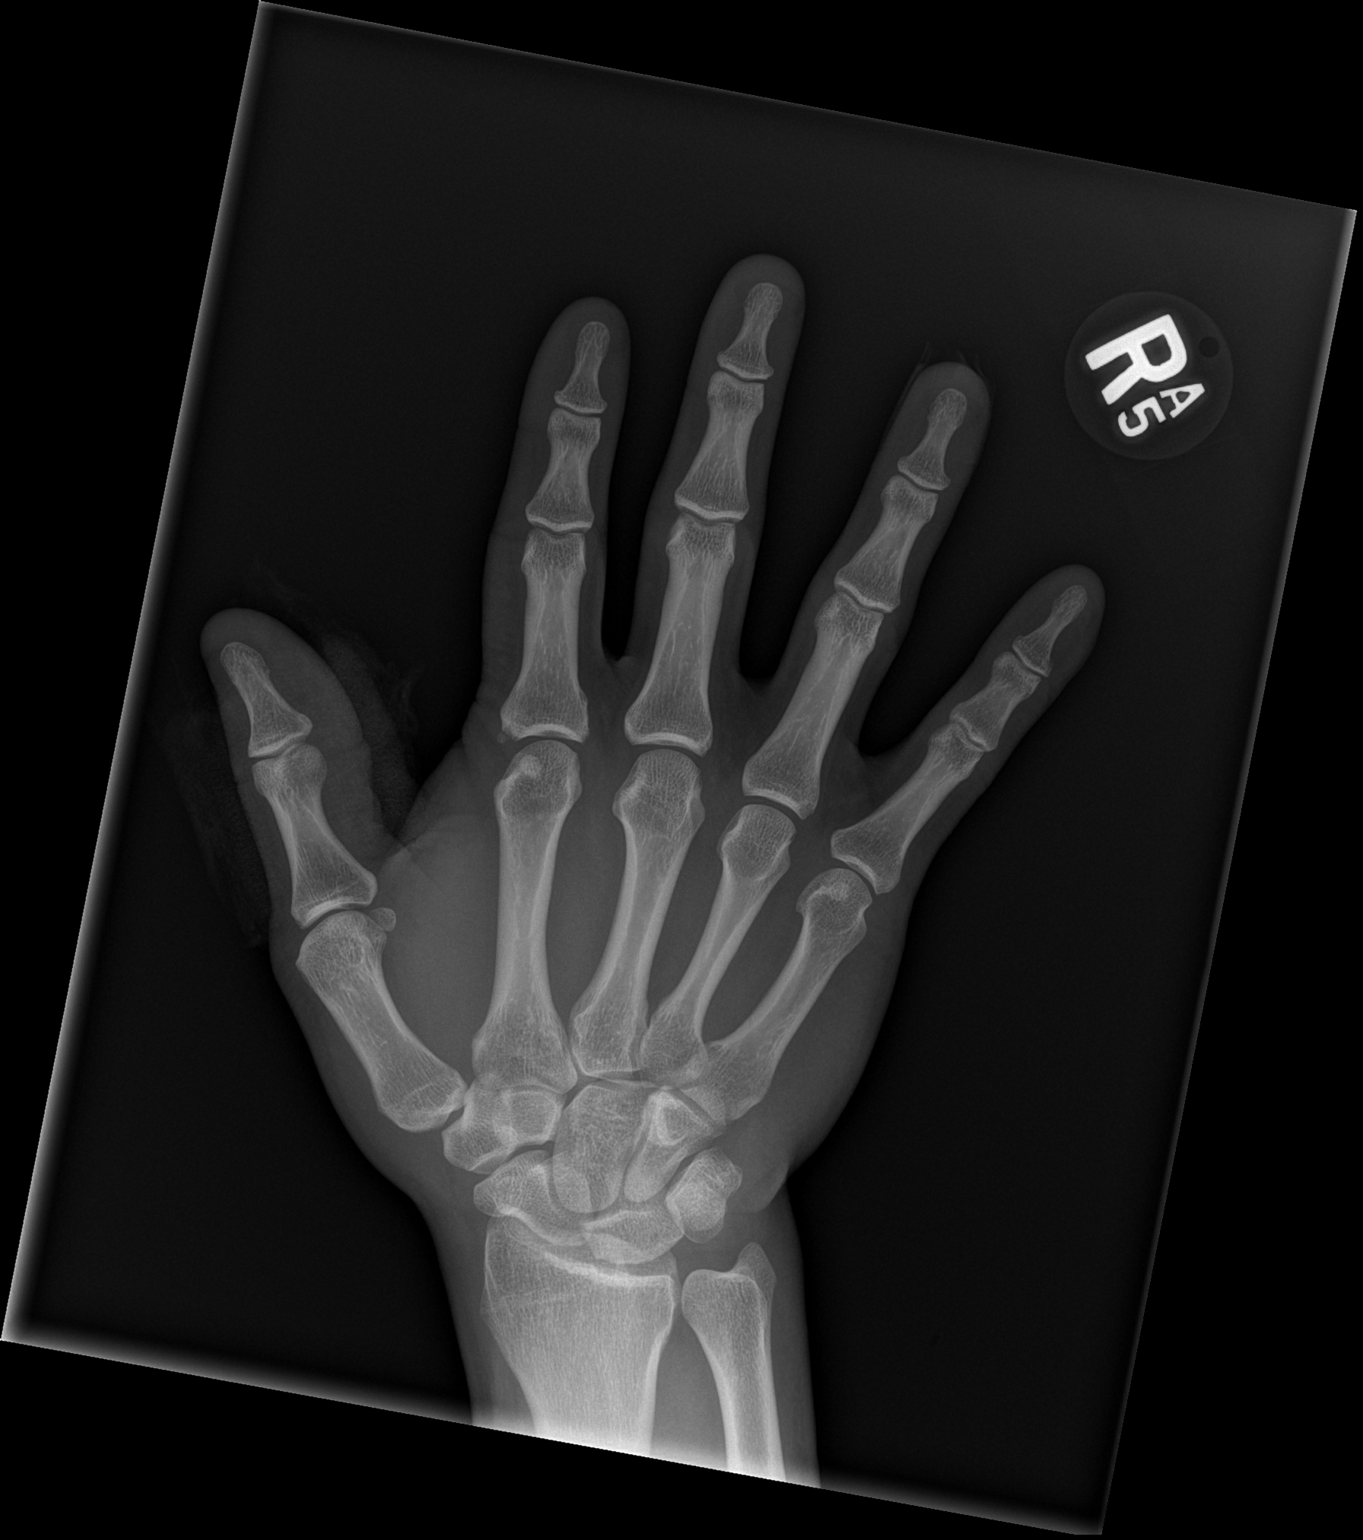

[x hand lat right]
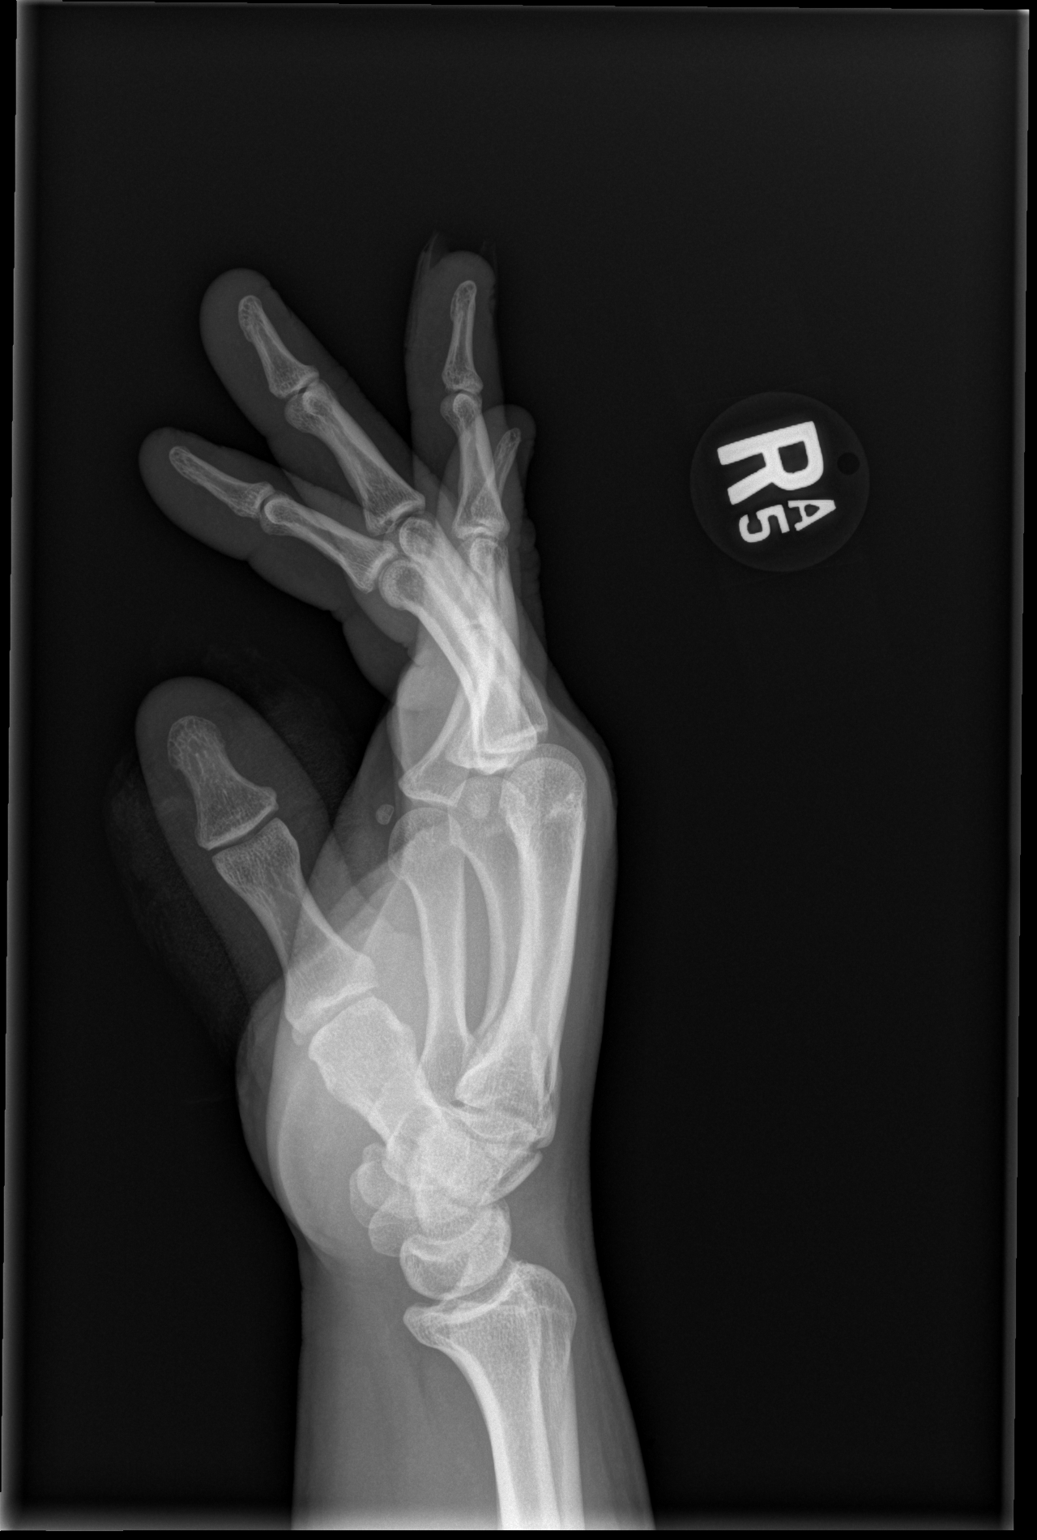

[2 of 2 positions shown; findings below may reference images not displayed]

FINDINGS: No fracture, dislocation or radiopaque foreign body is identified.
Bandaging about the right thumb is noted.
IMPRESSION: Laceration without underlying fracture or foreign body.

## 2019-06-14 ENCOUNTER — Encounter (HOSPITAL_COMMUNITY): Payer: Self-pay

## 2019-06-14 ENCOUNTER — Other Ambulatory Visit: Payer: Self-pay

## 2019-06-14 ENCOUNTER — Ambulatory Visit (HOSPITAL_COMMUNITY)
Admission: EM | Admit: 2019-06-14 | Discharge: 2019-06-14 | Disposition: A | Discharge status: Home / Self Care | Payer: HRSA Program | Attending: Family Medicine | Admitting: Family Medicine

## 2019-06-14 DIAGNOSIS — Z20828 Contact with and (suspected) exposure to other viral communicable diseases: Secondary | ICD-10-CM

## 2019-06-14 DIAGNOSIS — U071 COVID-19: Secondary | ICD-10-CM | POA: Diagnosis not present

## 2019-06-14 DIAGNOSIS — Z20822 Contact with and (suspected) exposure to covid-19: Secondary | ICD-10-CM

## 2019-06-14 NOTE — ED Triage Notes (Signed)
Patient presents to Urgent Care with complaints of needing a covid test to return to work since being exposed 4 days ago. Patient reports he has had intermittent headaches, denies other sx.

## 2019-06-14 NOTE — Discharge Instructions (Addendum)
If your Covid-19 test is positive, you will receive a phone call from [facility] regarding your results. Negative test results are not called. Both positive and negative results area always visible on MyChart. If you do not have a MyChart account, sign up instructions are in your discharge papers.  

## 2019-06-14 NOTE — ED Provider Notes (Signed)
[facility]   [id number] 06/14/19 Arrival Time: 1535  ASSESSMENT & PLAN:  1. Exposure to COVID-19 virus      COVID-19 testing sent. See work note for self-isolation instructions.  Follow-up Information    [facility].   Specialty: Urgent Care Why: As needed. Contact information: [address] [place] [address] [phone]          Reviewed expectations re: course of current medical issues. Questions answered. Outlined signs and symptoms indicating need for more acute intervention. Patient verbalized understanding. After Visit Summary given.   SUBJECTIVE: History from: patient. Bob Vargas is a 28 y.o. male who requests COVID-19 testing. Known COVID-19 contact: co-workers. Recent travel: none. Denies: runny nose, congestion, fever, cough, sore throat and difficulty breathing. Does reports a mild headache over the past few days. Normal PO intake without n/v/d.  ROS: As per HPI.   OBJECTIVE:  Vitals:   06/14/19 1642  BP: 118/68  Pulse: 77  Resp: 15  Temp: 98.2 F (36.8 C)  TempSrc: Oral  SpO2: 99%    General appearance: alert; no distress Eyes: PERRLA; EOMI; conjunctiva normal HENT: [place]; AT; nasal mucosa normal; oral mucosa normal Neck: supple  Lungs: speaks full sentences without difficulty; unlabored Heart: regular rate and rhythm Abdomen: soft, non-tender Extremities: no edema Skin: warm and dry Neurologic: normal gait Psychological: alert and cooperative; normal mood and affect  Labs:  Labs Reviewed  NOVEL CORONAVIRUS, NAA (HOSP ORDER, SEND-OUT TO REF LAB; TAT 18-24 HRS)     No Known Allergies  Past Medical History:  Diagnosis Date  . ADHD (attention deficit hyperactivity disorder)   . Allergy   . Headache(784.0)   . Hepatitis C   . MRSA infection (methicillin-resistant Staphylococcus aureus)    has had 3 times in past   Social History   Socioeconomic History    . Marital status: Single    Spouse name: Not on file  . Number of children: Not on file  . Years of education: Not on file  . Highest education level: Not on file  Occupational History  . Not on file  Tobacco Use  . Smoking status: Current Every Day Smoker    Packs/day: 0.25    Types: Cigarettes  . Smokeless tobacco: Never Used  Substance and Sexual Activity  . Alcohol use: Not Currently    Alcohol/week: 14.0 standard drinks    Types: 14 Cans of beer per week  . Drug use: Not Currently    Types: Marijuana    Comment: takes methadone at the [facility]  . Sexual activity: Not on file  Other Topics Concern  . Not on file  Social History Narrative  . Not on file   Social Determinants of Health   Financial Resource Strain:   . Difficulty of Paying Living Expenses: Not on file  Food Insecurity:   . Worried About Programme researcher, broadcasting/film/video in the Last Year: Not on file  . Ran Out of Food in the Last Year: Not on file  Transportation Needs:   . Lack of Transportation (Medical): Not on file  . Lack of Transportation (Non-Medical): Not on file  Physical Activity:   . Days of Exercise per Week: Not on file  . Minutes of Exercise per Session: Not on file  Stress:   . Feeling of Stress : Not on file  Social Connections:   . Frequency of Communication with Friends and Family: Not on file  .  Frequency of Social Gatherings with Friends and Family: Not on file  . Attends Religious Services: Not on file  . Active Member of Clubs or Organizations: Not on file  . Attends Archivist Meetings: Not on file  . Marital Status: Not on file  Intimate Partner Violence:   . Fear of Current or Ex-Partner: Not on file  . Emotionally Abused: Not on file  . Physically Abused: Not on file  . Sexually Abused: Not on file   Family History  Problem Relation Age of Onset  . Healthy Mother   . Hyperlipidemia Father   . Alcoholism Father    Past Surgical History:  Procedure Laterality Date   . FOREIGN BODY REMOVAL Right 10/06/2013   Procedure: FOREIGN BODY REMOVAL ADULT right index finger;  Surgeon: Schuyler Amor, MD;  Location: [facility];  Service: Orthopedics;  Laterality: Right;  . KNEE ARTHROSCOPY Right   . KNEE SURGERY Right 07/06/2015  . Fransisco Beau, MD 06/14/19 [phone]

## 2019-06-15 ENCOUNTER — Telehealth (HOSPITAL_COMMUNITY): Payer: Self-pay | Admitting: Emergency Medicine

## 2019-06-15 LAB — NOVEL CORONAVIRUS, NAA (HOSP ORDER, SEND-OUT TO REF LAB; TAT 18-24 HRS): SARS-CoV-2, NAA: DETECTED — AB

## 2019-06-15 NOTE — Telephone Encounter (Signed)
Your test for COVID-19 was positive, meaning that you were infected with the novel coronavirus and could give the germ to others.  Please continue isolation at home for at least 10 days since the start of your symptoms. If you do not have symptoms, please isolate at home for 10 days from the day you were tested. Once you complete your 10 day quarantine, you may return to normal activities as long as you've not had a fever for over 24 hours(without taking fever reducing medicine) and your symptoms are improving. Please continue good preventive care measures, including:  frequent hand-washing, avoid touching your face, cover coughs/sneezes, stay out of crowds and keep a 6 foot distance from others.  Go to the nearest hospital emergency room if fever/cough/breathlessness are severe or illness seems like a threat to life.   Patient contacted by phone and made aware of    results. Pt verbalized understanding and had all questions answered.  Pt states he has a severe headache. Instructed pt on max dose that he can take for otc and if headache gets worse or is uncontrolled to proceed to ER for treatment.

## 2019-07-14 IMAGING — CT CT ANGIO EXTREM UP*R*
2 of 5 series · 12 of 46 positions shown, 14 images · IV contrast (ISOVUE)
Comparison: Radiograph 08/07/2017

CLINICAL DATA: History of IV drug abuse with severe wrist pain

EXAM:
CT ANGIOGRAPHY UPPER RIGHT EXTREMITY
TECHNIQUE: CT angiography of the right upper extremity performed following
intravenous contrast administration. Coronal and sagittal
reformatted images are submitted.
CONTRAST:  100mL CK109L-DAW IOPAMIDOL (CK109L-DAW) INJECTION 76%

[Series 5: axial arterial · axial · arterial · 0.34mm/px · z∈[-782,-104]mm · 9 of 260 slices shown, 11 images]
[im 17/260  soft-tissue]
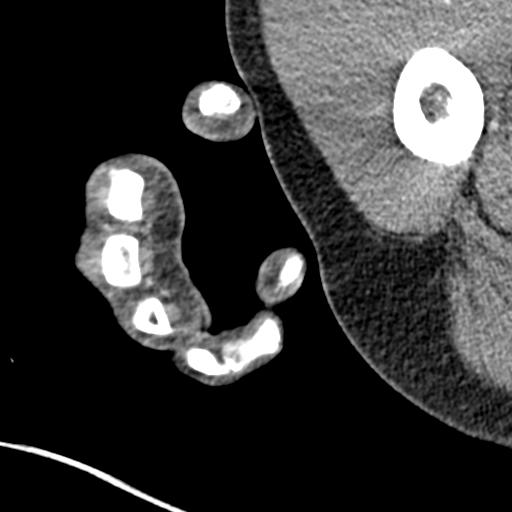
[im 17/260  bone]
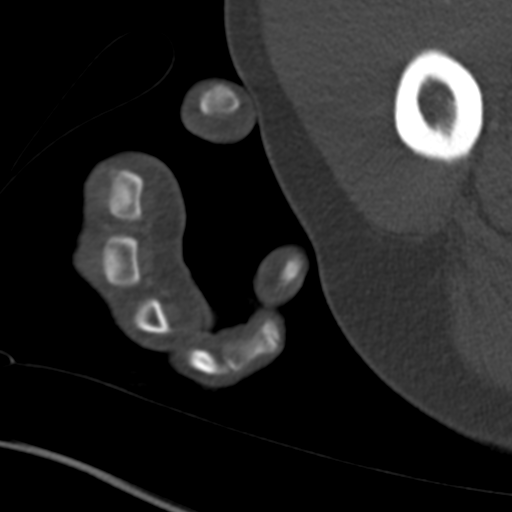
[im 51/260  soft-tissue]
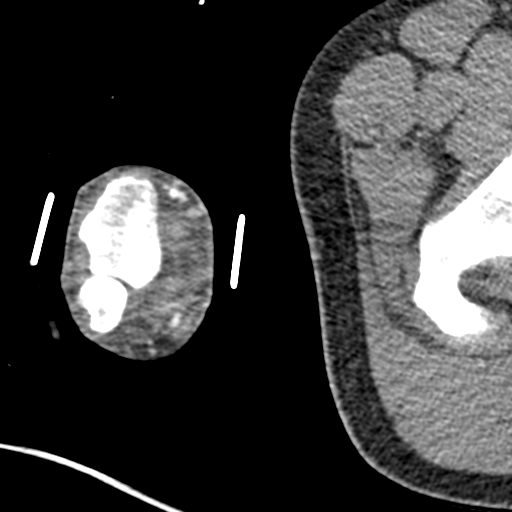
[im 76/260  soft-tissue]
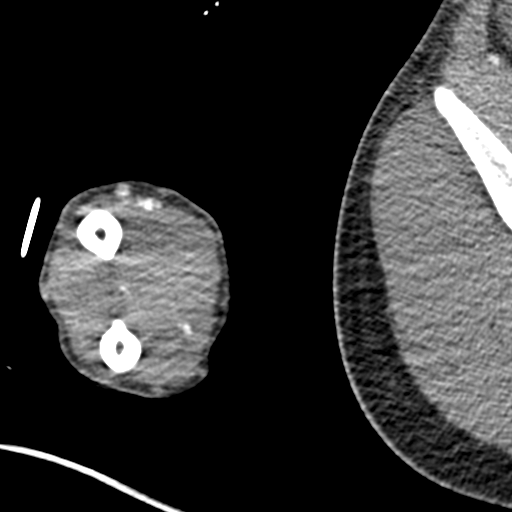
[im 101/260  soft-tissue]
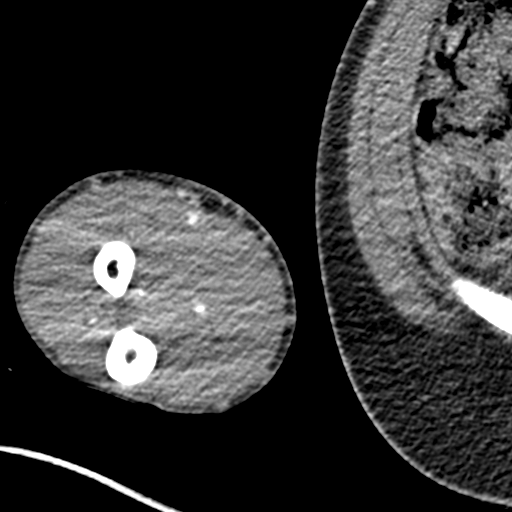
[im 134/260  soft-tissue]
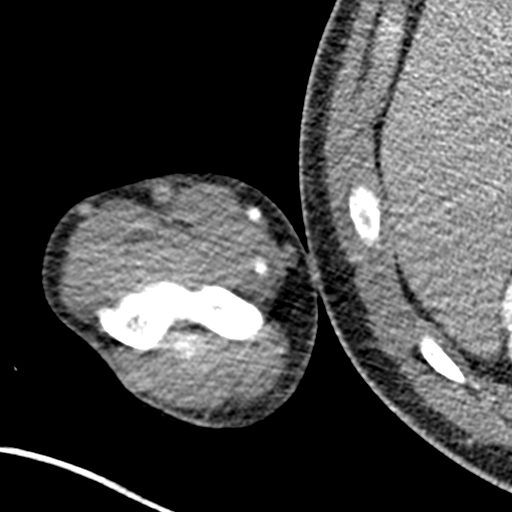
[im 159/260  soft-tissue]
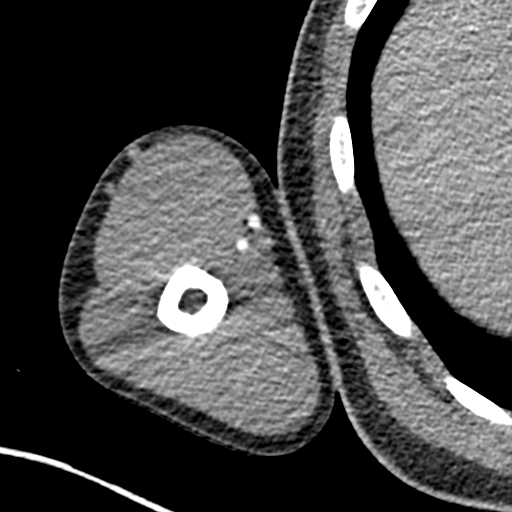
[im 184/260  soft-tissue]
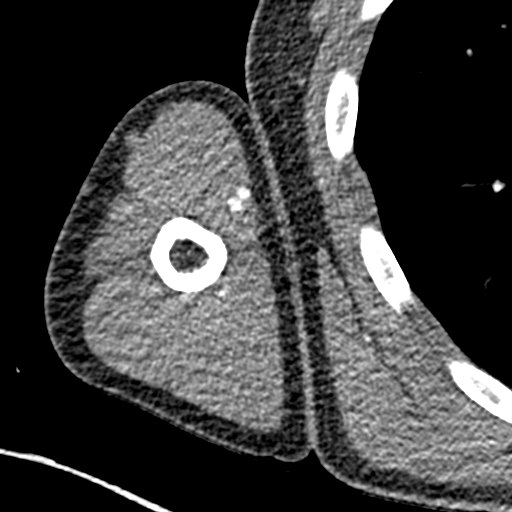
[im 218/260  soft-tissue]
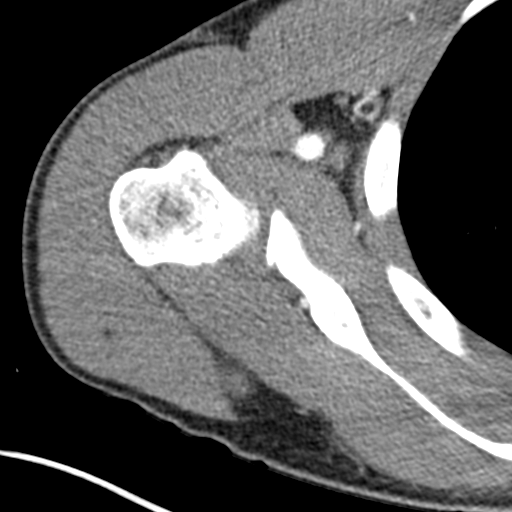
[im 243/260  soft-tissue]
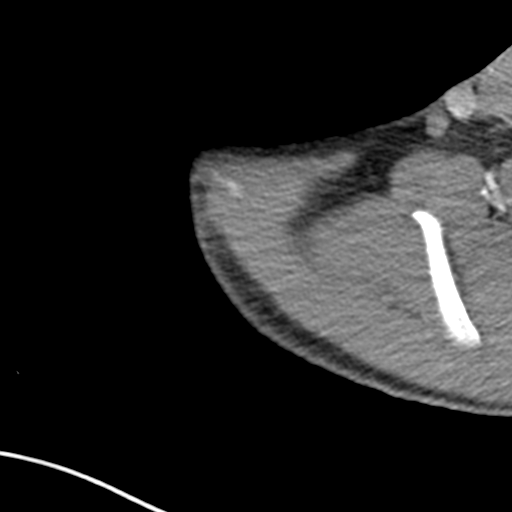
[im 243/260  bone]
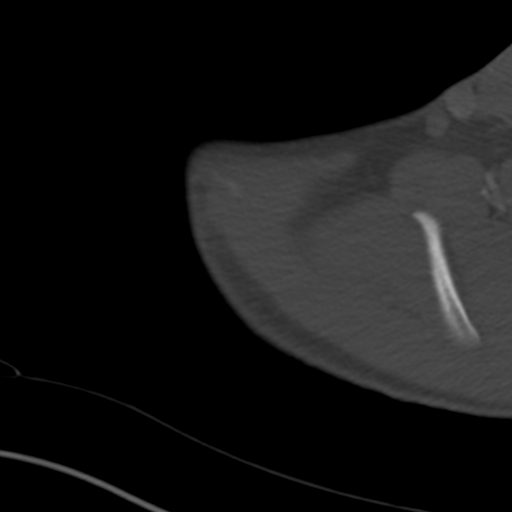

[Series 6: coronals · coronal · 0.58mm/px · 3 of 113 slices shown]
[im 29/113  soft-tissue]
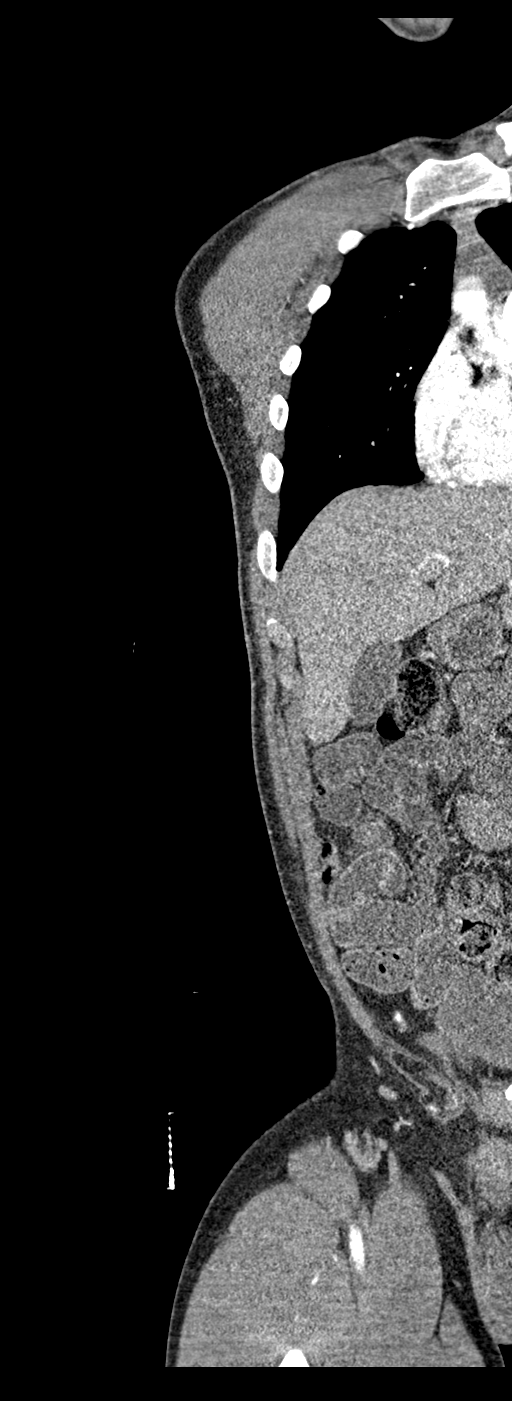
[im 57/113  soft-tissue]
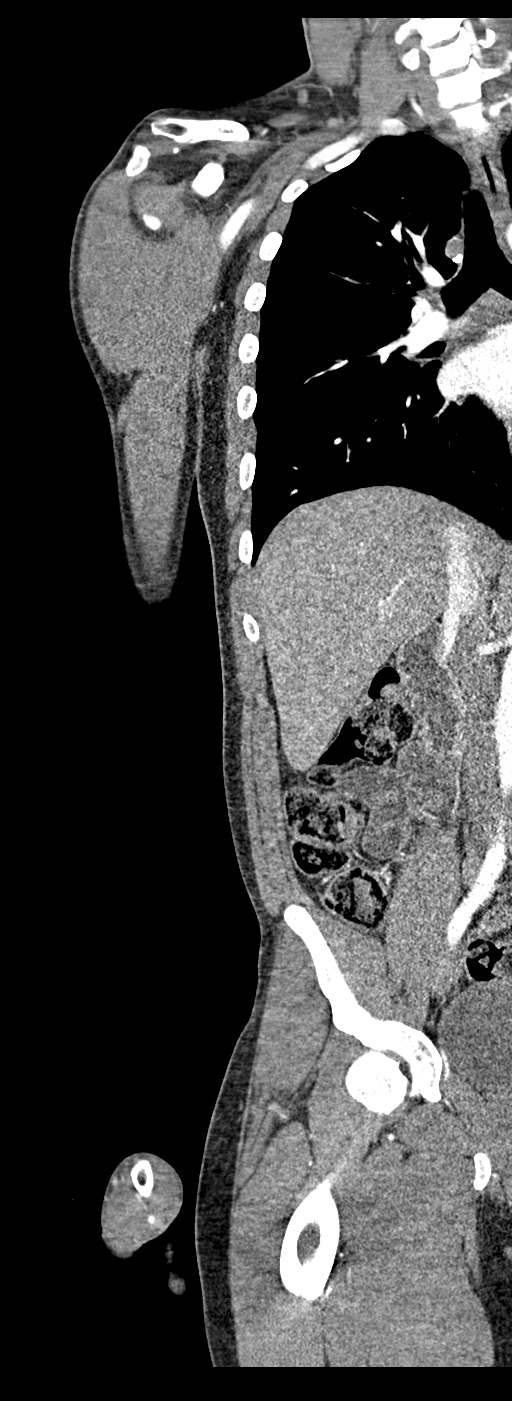
[im 85/113  soft-tissue]
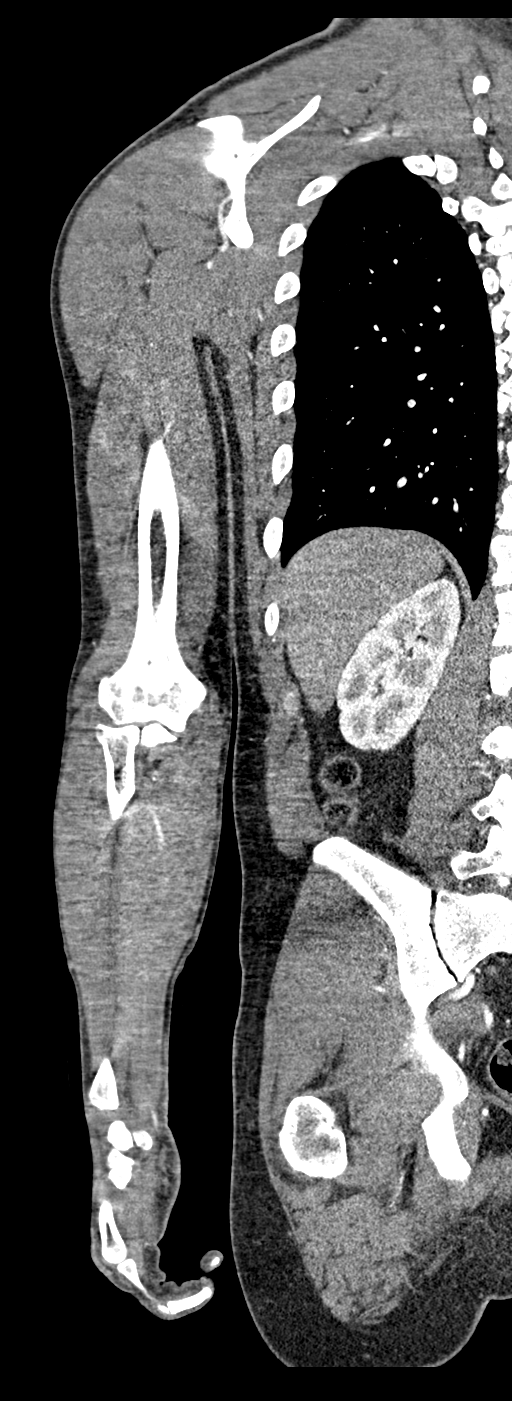

[12 of 46 positions shown; findings below may reference images not displayed]

FINDINGS: Vascular: Right subclavian and axillary vessels demonstrate normal
enhancement with no stenosis or occlusion. Brachial artery
demonstrates high bifurcation in the mid upper arm with superficial
radial artery. The radial and ulnar arteries demonstrate normal flow
enhancement and no evidence for occlusion. Vascular enhancement is
seen to the level of the wrist/proximal hand.

Nonvascular: No focal fluid collections within the soft tissues of
the right upper arm. Bones demonstrate no fracture or malalignment.
No periostitis. Imaged right lung parenchyma is clear. No axillary
adenopathy

Review of the MIP images confirms the above findings.
IMPRESSION: Vascular patency of the right upper extremity arterial vessels from
the axilla to the wrist/proximal hand.

## 2020-03-25 ENCOUNTER — Telehealth: Payer: Self-pay

## 2020-03-25 NOTE — Telephone Encounter (Signed)
Pt. Called stating he used to go here and saw you when he did. He wanted to know if he could schedule a CPE with and discuss some other health issues. I told him I would have to run it by you first since he has not been seen in 4 years.

## 2020-03-25 NOTE — Telephone Encounter (Signed)
Go ahead and schedule him.

## 2020-03-26 NOTE — Telephone Encounter (Signed)
Called pt. Got him scheduled 04/09/20 new/old pt.

## 2020-04-09 ENCOUNTER — Ambulatory Visit: Payer: Self-pay | Admitting: Family Medicine

## 2020-05-07 ENCOUNTER — Encounter: Payer: Self-pay | Admitting: Family Medicine

## 2020-05-07 ENCOUNTER — Other Ambulatory Visit: Payer: Self-pay

## 2020-05-07 ENCOUNTER — Ambulatory Visit (INDEPENDENT_AMBULATORY_CARE_PROVIDER_SITE_OTHER): Payer: BC Managed Care – PPO | Admitting: Family Medicine

## 2020-05-07 VITALS — BP 104/68 | HR 75 | Temp 97.6°F | Ht 69.0 in | Wt 190.6 lb

## 2020-05-07 DIAGNOSIS — F909 Attention-deficit hyperactivity disorder, unspecified type: Secondary | ICD-10-CM

## 2020-05-07 DIAGNOSIS — G479 Sleep disorder, unspecified: Secondary | ICD-10-CM | POA: Diagnosis not present

## 2020-05-07 DIAGNOSIS — Z209 Contact with and (suspected) exposure to unspecified communicable disease: Secondary | ICD-10-CM

## 2020-05-07 DIAGNOSIS — B1920 Unspecified viral hepatitis C without hepatic coma: Secondary | ICD-10-CM | POA: Diagnosis not present

## 2020-05-07 DIAGNOSIS — L723 Sebaceous cyst: Secondary | ICD-10-CM

## 2020-05-07 MED ORDER — AMPHETAMINE-DEXTROAMPHETAMINE 20 MG PO TABS: 20.0000 mg | ORAL_TABLET | Freq: Every day | ORAL | 0 refills | Status: DC

## 2020-05-07 MED ORDER — TRAZODONE HCL 50 MG PO TABS: 50.0000 mg | ORAL_TABLET | Freq: Every evening | ORAL | 1 refills | Status: DC | PRN

## 2020-05-07 NOTE — Patient Instructions (Signed)
Let me know if the Adderall works, how long it works and do you have any troubles with it

## 2020-05-07 NOTE — Progress Notes (Signed)
   Subjective:    Patient ID: Bob Vargas, male    DOB: 01-31-1991, 29 y.o.   MRN: [id number]  HPI He is here for consult concerning multiple issues.  He has a long history of drug abuse and had been going to the methadone clinic here in [place].  Apparently the diagnosing with hepatitis C but he did not have insurance and they did not refer him to anybody in particular.  They did continue him on trazodone.  He has been on a sleep med for quite some time.  He also has a history of ADHD.  In the past he was on Adderall XR20 while he was in high school.  He is here for follow-up on these. He also has a lesion on his back and he would like me to look at. He now does have insurance.  He also has a child coming into the world in about 4 months. Review of Systems     Objective:   Physical Exam Alert and in no distress.  Round smooth lesion noted on the left upper back with a small dimple. Medical record was reviewed.  His last visit here was December 2010.      Assessment & Plan:  Hepatitis C virus infection without hepatic coma, unspecified chronicity - Plan: Hepatitis C antibody, Hepatitis C RNA quantitative, Hepatitis C Genotype  Contact with or exposure to communicable disease - Plan: RPR+HIV+GC+CT Panel, Hepatitis B surface antibody,qualitative  Attention deficit hyperactivity disorder (ADHD), unspecified ADHD type - Plan: amphetamine-dextroamphetamine (ADDERALL) 20 MG tablet  Sleep disturbance - Plan: traZODone (DESYREL) 50 MG tablet  Sebaceous cyst Review of his record indicates he definitely has ADHD and did respond nicely to the Adderall.  I will place him on short acting medication.  He will keep in touch with me on efficacy and possible side effects.  He might not need a longer acting Friday. Continue trazodone as he seems to be doing well on that. Since he apparently has a diagnosis of hepatitis C, I would do further evaluation and probably refer him to infectious  disease for further care of this.  Since we are looking at this, think it is appropriate to look for other infectious diseases.  He was comfortable with that.  He will keep me informed as to the efficacy of the Adderall. 35 minutes spent in review of records history and examination.

## 2020-05-08 ENCOUNTER — Encounter: Payer: Self-pay | Admitting: Family Medicine

## 2020-05-08 LAB — HCV RNA BY PCR, QN RFX GENO

## 2020-05-10 LAB — HEPATITIS C GENOTYPE

## 2020-05-10 LAB — HCV RNA BY PCR, QN RFX GENO: HCV Quant Baseline: NOT DETECTED IU/mL

## 2020-05-10 LAB — RPR+HIV+GC+CT PANEL
Chlamydia trachomatis, NAA: NEGATIVE
HIV Screen 4th Generation wRfx: NONREACTIVE
Neisseria Gonorrhoeae by PCR: NEGATIVE
RPR Ser Ql: NONREACTIVE

## 2020-05-10 LAB — HEPATITIS C ANTIBODY: Hep C Virus Ab: 2.7 s/co ratio — ABNORMAL HIGH (ref 0.0–0.9)

## 2020-05-10 LAB — HEPATITIS B SURFACE ANTIBODY,QUALITATIVE: Hep B Surface Ab, Qual: REACTIVE

## 2020-06-10 ENCOUNTER — Ambulatory Visit (INDEPENDENT_AMBULATORY_CARE_PROVIDER_SITE_OTHER): Payer: BC Managed Care – PPO | Admitting: Family Medicine

## 2020-06-10 ENCOUNTER — Encounter: Payer: Self-pay | Admitting: Family Medicine

## 2020-06-10 VITALS — BP 108/68 | HR 78 | Ht 69.0 in | Wt 193.0 lb

## 2020-06-10 DIAGNOSIS — F909 Attention-deficit hyperactivity disorder, unspecified type: Secondary | ICD-10-CM | POA: Diagnosis not present

## 2020-06-10 DIAGNOSIS — Z8619 Personal history of other infectious and parasitic diseases: Secondary | ICD-10-CM | POA: Insufficient documentation

## 2020-06-10 MED ORDER — AMPHETAMINE-DEXTROAMPHETAMINE 20 MG PO TABS: 20.0000 mg | ORAL_TABLET | Freq: Two times a day (BID) | ORAL | 0 refills | Status: DC

## 2020-06-10 NOTE — Progress Notes (Signed)
   Subjective:    Patient ID: Bob Vargas, male    DOB: 21-Jan-1991, 29 y.o.   MRN: [id number]  HPI He is here for an interval evaluation.  He did have blood work done which did show that he is now hepatitis B antibody positive and also hepatitis C positive but negative viral load. He also recently started back taking Adderall 20 mg.  He states that it lasts roughly 4 hours and occasionally he will take another half dose.  His usual workday is a 10-hour timeframe.  Review of Systems     Objective:   Physical Exam  Alert and in no distress otherwise not examined      Assessment & Plan:  Attention deficit hyperactivity disorder (ADHD), unspecified ADHD type - Plan: amphetamine-dextroamphetamine (ADDERALL) 20 MG tablet  History of hepatitis C  History of hepatitis B After discussion with him, I will give him twice per day dosing.  He plans to use either once or twice per day depending upon his workload.  I explained to them comfortable with that. I then explained that he has had hepatitis C but his viral load is undetectable and therefore no further work-up is needed.  Also his hepatitis B antibody was positive indicating previous exposure and now he has cleared this.

## 2020-06-18 ENCOUNTER — Emergency Department (HOSPITAL_COMMUNITY)
Admission: EM | Admit: 2020-06-18 | Discharge: 2020-06-18 | Disposition: A | Discharge status: Home / Self Care | Payer: BC Managed Care – PPO | Attending: Emergency Medicine | Admitting: Emergency Medicine

## 2020-06-18 ENCOUNTER — Encounter (HOSPITAL_COMMUNITY): Payer: Self-pay | Admitting: Emergency Medicine

## 2020-06-18 ENCOUNTER — Encounter: Payer: Self-pay | Admitting: Family Medicine

## 2020-06-18 DIAGNOSIS — F909 Attention-deficit hyperactivity disorder, unspecified type: Secondary | ICD-10-CM

## 2020-06-18 DIAGNOSIS — T401X1A Poisoning by heroin, accidental (unintentional), initial encounter: Secondary | ICD-10-CM

## 2020-06-18 DIAGNOSIS — F17210 Nicotine dependence, cigarettes, uncomplicated: Secondary | ICD-10-CM | POA: Diagnosis not present

## 2020-06-18 NOTE — Congregational Nurse Program (Signed)
Pt girlfriend Judeth Cornfield) contacted [facility] to obtain a referral for local substance abuse programs within [place] for suboxone treatment.    Referrals provided were for [facility] and for [facility]  Understanding of resources and contact information was clear and call then ended.

## 2020-06-18 NOTE — Discharge Instructions (Addendum)
I gave you information if you want to seek help to help you stop doing the heroin again.

## 2020-06-18 NOTE — ED Notes (Signed)
Pt states he feels fine and wants to walk home.

## 2020-06-18 NOTE — ED Triage Notes (Signed)
Pt brought in by EMS after drug Overdose with Heroin possibly mixed with Fentanyl. Per EMS, pt was "blue" when they arrived. EMS gave pt 4mg  Narcan intranasally. Pt alert and oriented upon arrival to ED. Pt ambulated in with EMS.

## 2020-06-18 NOTE — ED Provider Notes (Signed)
[facility] EMERGENCY DEPARTMENT Provider Note   CSN: [id number] Arrival date & time: 06/18/20  0455   Time seen 6:05 AM  History Chief Complaint  Patient presents with  . Drug Overdose    Bob Vargas is a 30 y.o. male.  HPI   When I asked patient what happened tonight he states "nothing".  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  He states he last sought treatment 2 years ago when he went to [facility] and he was sober for about a year.  He denies feeling depressed or having suicidal or homicidal ideation.  Patient is very withdrawn and does not want to answer questions.  EMS report they got called that he had overdosed on heroin.  When they arrived he was blue and he received Narcan 4 mg intranasally.  He was ambulated from the ambulance into the ED.  PCP Ronnald Nian, MD   Past Medical History:  Diagnosis Date  . ADHD (attention deficit hyperactivity disorder)   . Allergy   . Headache(784.0)   . Hepatitis C   . MRSA infection (methicillin-resistant Staphylococcus aureus)    has had 3 times in past    Patient Active Problem List   Diagnosis Date Noted  . History of hepatitis B 06/10/2020  . Current smoker 07/30/2015  . Hydrocele in adult 07/30/2015    Past Surgical History:  Procedure Laterality Date  . FOREIGN BODY REMOVAL Right 10/06/2013   Procedure: FOREIGN BODY REMOVAL ADULT right index finger;  Surgeon: Marlowe Shores, MD;  Location: [facility];  Service: Orthopedics;  Laterality: Right;  . KNEE ARTHROSCOPY Right   . KNEE SURGERY Right 07/06/2015  . MYRINGOTOMY         Family History  Problem Relation Age of Onset  . Healthy Mother   . Hyperlipidemia Father   . Alcoholism Father     Social History   Tobacco Use  . Smoking status: Current Every Day Smoker    Packs/day: 0.25    Types: Cigarettes  . Smokeless tobacco: Never Used  Vaping Use  . Vaping Use: Never used  Substance Use Topics  .  Alcohol use: Not Currently    Alcohol/week: 14.0 standard drinks    Types: 14 Cans of beer per week  . Drug use: Yes    Types: IV    Comment: Heroin with Fentanyl    Home Medications Prior to Admission medications   Medication Sig Start Date End Date Taking? Authorizing Provider  amphetamine-dextroamphetamine (ADDERALL) 20 MG tablet Take 1 tablet (20 mg total) by mouth 2 (two) times daily. 06/10/20   Ronnald Nian, MD  baclofen (LIORESAL) 20 MG tablet Take 20 mg by mouth 2 (two) times daily. Patient not taking: Reported on 06/10/2020 06/06/19   [provider]  ibuprofen (ADVIL) 200 MG tablet Take 200 mg by mouth every 6 (six) hours as needed. Patient not taking: Reported on 06/10/2020    [provider]  traZODone (DESYREL) 50 MG tablet Take 1 tablet (50 mg total) by mouth at bedtime as needed for sleep. 05/07/20   Ronnald Nian, MD    Allergies    Patient has no known allergies.  Review of Systems   Review of Systems  All other systems reviewed and are negative.   Physical Exam Updated Vital Signs BP 109/71   Pulse 72   Temp 98.2 F (36.8 C) (Oral)   Resp 16  Ht 5\' 9"  (1.753 m)   Wt 87.5 kg   SpO2 96%   BMI 28.49 kg/m   Physical Exam Vitals and nursing note reviewed.  Constitutional:      Comments: Patient appears ill kept  HENT:     Head: Normocephalic and atraumatic.     Right Ear: External ear normal.     Left Ear: External ear normal.  Eyes:     Extraocular Movements: Extraocular movements intact.     Pupils: Pupils are equal, round, and reactive to light.  Cardiovascular:     Rate and Rhythm: Normal rate and regular rhythm.     Pulses: Normal pulses.     Heart sounds: Normal heart sounds. No murmur heard.   Pulmonary:     Effort: Pulmonary effort is normal. No respiratory distress.     Breath sounds: Normal breath sounds. No stridor. No wheezing, rhonchi or rales.  Musculoskeletal:     Cervical back: Normal range of motion.   Skin:    General: Skin is warm and dry.  Neurological:     General: No focal deficit present.     Mental Status: He is alert and oriented to person, place, and time.  Psychiatric:        Mood and Affect: Affect is flat.        Speech: Speech is delayed.        Behavior: Behavior is slowed.     ED Results / Procedures / Treatments   Labs (all labs ordered are listed, but only abnormal results are displayed) Labs Reviewed - No data to display  EKG None  Radiology No results found.  Procedures Procedures (including critical care time)  Medications Ordered in ED Medications - No data to display  ED Course  I have reviewed the triage vital signs and the nursing notes.  Pertinent labs & imaging results that were available during my care of the patient were reviewed by me and considered in my medical decision making (see chart for details).    MDM Rules/Calculators/A&P                          When I talked to patient he mainly holds his head down for his eyes closed and I cannot tell if he is awake or not.  When I repeated my questions he started getting upset with me stating "I am awake".  I told him one of the ways we can assess whether he is ready to be discharged home is if he stays awake and is able to answer questions.  He was advised he would be observed until about 7 AM which will be 2 hours in the emergency department and hopefully he can be discharged home then.   7:00 AM patient was observed for 2 hours in the ED, EMS had given him Narcan prior to arrival.  He was felt to be safe to be discharged.  He was given resource guide to get outpatient and inpatient treatment if he desires.  Patient denied feeling depressed or suicidal or homicidal also he does not appear to need acute psychiatric intervention.  Final Clinical Impression(s) / ED Diagnoses Final diagnoses:  Accidental overdose of heroin, initial encounter [facility])    Rx / DC Orders ED Discharge Orders     None     Plan discharge  [facility], MD, Devoria Albe, MD 06/18/20 [phone]

## 2020-07-10 MED ORDER — AMPHETAMINE-DEXTROAMPHETAMINE 20 MG PO TABS: 20.0000 mg | ORAL_TABLET | Freq: Two times a day (BID) | ORAL | 0 refills | Status: DC

## 2020-07-11 ENCOUNTER — Other Ambulatory Visit: Payer: Self-pay | Admitting: Family Medicine

## 2020-07-11 DIAGNOSIS — G479 Sleep disorder, unspecified: Secondary | ICD-10-CM

## 2020-07-11 NOTE — Telephone Encounter (Signed)
[facility] is requesting to fill pt trazodone. Please advise [facility] 

## 2020-08-12 ENCOUNTER — Encounter: Payer: Self-pay | Admitting: Family Medicine

## 2020-08-12 DIAGNOSIS — F909 Attention-deficit hyperactivity disorder, unspecified type: Secondary | ICD-10-CM

## 2020-08-12 MED ORDER — AMPHETAMINE-DEXTROAMPHETAMINE 20 MG PO TABS: 20.0000 mg | ORAL_TABLET | Freq: Two times a day (BID) | ORAL | 0 refills | Status: DC

## 2020-08-15 ENCOUNTER — Other Ambulatory Visit: Payer: Self-pay | Admitting: Family Medicine

## 2020-08-15 DIAGNOSIS — G479 Sleep disorder, unspecified: Secondary | ICD-10-CM

## 2020-08-15 NOTE — Telephone Encounter (Signed)
[facility] is requesting to fill pt trazodone. Please advise [facility] 

## 2020-09-11 MED ORDER — AMPHETAMINE-DEXTROAMPHETAMINE 20 MG PO TABS: 20.0000 mg | ORAL_TABLET | Freq: Two times a day (BID) | ORAL | 0 refills | Status: DC

## 2020-09-11 NOTE — Addendum Note (Signed)
Addended by: Ronnald Nian on: 09/11/2020 08:37 PM   Modules accepted: Orders

## 2020-09-16 ENCOUNTER — Other Ambulatory Visit: Payer: Self-pay | Admitting: Family Medicine

## 2020-09-16 DIAGNOSIS — G479 Sleep disorder, unspecified: Secondary | ICD-10-CM

## 2020-09-16 NOTE — Telephone Encounter (Signed)
[facility] is requesting to fill pt trazodone. Please advise [facility] 

## 2020-10-14 ENCOUNTER — Other Ambulatory Visit: Payer: Self-pay | Admitting: Family Medicine

## 2020-10-14 DIAGNOSIS — G479 Sleep disorder, unspecified: Secondary | ICD-10-CM

## 2020-10-14 MED ORDER — AMPHETAMINE-DEXTROAMPHETAMINE 20 MG PO TABS: 20.0000 mg | ORAL_TABLET | Freq: Two times a day (BID) | ORAL | 0 refills | Status: DC

## 2020-10-14 NOTE — Telephone Encounter (Signed)
Pt. Requesting refill on his trazodone pt. Last apt was 05/31/20 next apt is 10/18/20.

## 2020-10-14 NOTE — Addendum Note (Signed)
Addended by: Ronnald Nian on: 10/14/2020 08:48 PM   Modules accepted: Orders

## 2020-10-18 ENCOUNTER — Encounter: Payer: Self-pay | Admitting: Family Medicine

## 2020-10-18 ENCOUNTER — Other Ambulatory Visit: Payer: Self-pay

## 2020-10-18 ENCOUNTER — Ambulatory Visit (INDEPENDENT_AMBULATORY_CARE_PROVIDER_SITE_OTHER): Payer: BC Managed Care – PPO | Admitting: Family Medicine

## 2020-10-18 VITALS — BP 120/80 | HR 71 | Temp 97.9°F | Ht 69.0 in | Wt 181.8 lb

## 2020-10-18 DIAGNOSIS — F909 Attention-deficit hyperactivity disorder, unspecified type: Secondary | ICD-10-CM

## 2020-10-18 DIAGNOSIS — K4090 Unilateral inguinal hernia, without obstruction or gangrene, not specified as recurrent: Secondary | ICD-10-CM | POA: Diagnosis not present

## 2020-10-18 NOTE — Progress Notes (Signed)
   Subjective:    Patient ID: Bob Vargas, male    DOB: Dec 14, 1990, 30 y.o.   MRN: [id number]  HPI He is here for evaluation of right inguinal swelling and discomfort.  He has had difficulty with this for the last year but in the last month or so the symptoms of gotten worse. He continues on his ADD medicine.  1 pill usually lasts around 4 hours.  He has no withdrawal symptoms from it.  Review of Systems     Objective:   Physical Exam Genital exam does show swelling in the inguinal area.       Assessment & Plan:  Right inguinal hernia  Attention deficit hyperactivity disorder (ADHD), unspecified ADHD type  Refer to general surgery.  Continue with Adderall.

## 2020-10-29 ENCOUNTER — Ambulatory Visit: Payer: BC Managed Care – PPO | Admitting: General Surgery

## 2020-11-01 ENCOUNTER — Other Ambulatory Visit: Payer: Self-pay

## 2020-11-01 ENCOUNTER — Ambulatory Visit (INDEPENDENT_AMBULATORY_CARE_PROVIDER_SITE_OTHER): Payer: BC Managed Care – PPO | Admitting: Surgery

## 2020-11-01 ENCOUNTER — Encounter: Payer: Self-pay | Admitting: Surgery

## 2020-11-01 VITALS — BP 126/73 | HR 68 | Temp 98.4°F | Ht 69.0 in | Wt 177.2 lb

## 2020-11-01 DIAGNOSIS — K4090 Unilateral inguinal hernia, without obstruction or gangrene, not specified as recurrent: Secondary | ICD-10-CM

## 2020-11-01 NOTE — Patient Instructions (Addendum)
Our surgery scheduler Britta Mccreedy will call you within 24-48 hours to get you scheduled. If you have not heard from her after 48 hours, please call our office. You will not need to get Covid tested before surgery and have the blue sheet available when she calls to write down important information.  If you have any concerns or questions, please feel free to call our office. See follow  Up appointment below.     Inguinal Hernia, Adult An inguinal hernia is when fat or your intestines push through a weak spot in a muscle where your leg meets your lower belly (groin). This causes a bulge. This kind of hernia could also be:  In your scrotum, if you are male.  In folds of skin around your vagina, if you are male. There are three types of inguinal hernias:  Hernias that can be pushed back into the belly (are reducible). This type rarely causes pain.  Hernias that cannot be pushed back into the belly (are incarcerated).  Hernias that cannot be pushed back into the belly and lose their blood supply (are strangulated). This type needs emergency surgery. What are the causes? This condition is caused by having a weak spot in the muscles or tissues in your groin. This develops over time. The hernia may poke through the weak spot when you strain your lower belly muscles all of a sudden, such as when you:  Lift a heavy object.  Strain to poop (have a bowel movement). Trouble pooping (constipation) can lead to straining.  Cough. What increases the risk? This condition is more likely to develop in:  Males.  Pregnant females.  People who: ? Are overweight. ? Work in jobs that require long periods of standing or heavy lifting. ? Have had an inguinal hernia before. ? Smoke or have lung disease. These factors can lead to long-term (chronic) coughing. What are the signs or symptoms? Symptoms may depend on the size of the hernia. Often, a small hernia has no symptoms. Symptoms of a larger hernia may  include:  A bulge in the groin area. This is easier to see when standing. You might not be able to see it when you are lying down.  Pain or burning in the groin. This may get worse when you lift, strain, or cough.  A dull ache or a feeling of pressure in the groin.  An abnormal bulge in the scrotum, in males. Symptoms of a strangulated inguinal hernia may include:  A bulge in your groin that is very painful and tender to the touch.  A bulge that turns red or purple.  Fever, feeling like you may vomit (nausea), and vomiting.  Not being able to poop or to pass gas. How is this treated? Treatment depends on the size of your hernia and whether you have symptoms. If you do not have symptoms, your doctor may have you watch your hernia carefully and have you come in for follow-up visits. If your hernia is large or if you have symptoms, you may need surgery to repair the hernia. Follow these instructions at home: Lifestyle  Avoid lifting heavy objects.  Avoid standing for long amounts of time.  Do not smoke or use any products that contain nicotine or tobacco. If you need help quitting, ask your doctor.  Stay at a healthy weight. Prevent trouble pooping You may need to take these actions to prevent or treat trouble pooping:  Drink enough fluid to keep your pee (urine) pale yellow.  Take  over-the-counter or prescription medicines.  Eat foods that are high in fiber. These include beans, whole grains, and fresh fruits and vegetables.  Limit foods that are high in fat and sugar. These include fried or sweet foods. General instructions  You may try to push your hernia back in place by very gently pressing on it when you are lying down. Do not try to push the bulge back in if it will not go in easily.  Watch your hernia for any changes in shape, size, or color. Tell your doctor if you see any changes.  Take over-the-counter and prescription medicines only as told by your  doctor.  Keep all follow-up visits. Contact a doctor if:  You have a fever or chills.  You have new symptoms.  Your symptoms get worse. Get help right away if:  You have pain in your groin that gets worse all of a sudden.  You have a bulge in your groin that: ? Gets bigger all of a sudden, and it does not get smaller after that. ? Turns red or purple. ? Is painful when you touch it.  You are a male, and you have: ? Sudden pain in your scrotum. ? A sudden change in the size of your scrotum.  You cannot push the hernia back in place by very gently pressing on it when you are lying down.  You feel like you may vomit, and that feeling does not go away.  You keep vomiting.  You have a fast heartbeat.  You cannot poop or pass gas. These symptoms may be an emergency. Get help right away. Call your local emergency services (911 in the [place]  Do not wait to see if the symptoms will go away.  Do not drive yourself to the hospital. Summary  An inguinal hernia is when fat or your intestines push through a weak spot in a muscle where your leg meets your lower belly (groin). This causes a bulge.  If you do not have symptoms, you may not need treatment. If you have symptoms or a large hernia, you may need surgery.  Avoid lifting heavy objects. Also, avoid standing for long amounts of time.  Do not try to push the bulge back in if it will not go in easily. This information is not intended to replace advice given to you by your health care provider. Make sure you discuss any questions you have with your health care provider. Document Revised: 01/30/2020 Document Reviewed: 01/30/2020 Elsevier Patient Education  2021 Elsevier Inc.      Laparoscopic Inguinal Hernia Repair, Adult Laparoscopic inguinal hernia repair is a surgical procedure to repair a small, weak spot in the groin muscles that allows fat or intestines from inside the abdomen to bulge out (inguinal hernia). This  procedure may be planned, or it may be an emergency procedure. During the procedure, tissue that has bulged out is moved back into place, and the opening in the groin muscles is repaired. This is done through three small incisions in the abdomen. A thin tube with a light and camera on the end (laparoscope) is used to help perform the procedure. Tell a health care provider about:  Any allergies you have.  All medicines you are taking, including vitamins, herbs, eye drops, creams, and over-the-counter medicines.  Any problems you or family members have had with anesthetic medicines.  Any blood disorders you have.  Any surgeries you have had.  Any medical conditions you have.  Whether you are pregnant  or may be pregnant. What are the risks? Generally, this is a safe procedure. However, problems may occur, including:  Infection.  Bleeding.  Allergic reactions to medicines.  Damage to nearby structures or organs.  Testicle damage or long-term pain and swelling of the scrotum, in males.  Inability to completely empty the bladder (urinary retention).  Blood clots.  A collection of fluid that builds up under the skin (seroma).  The hernia coming back (recurrence). What happens before the procedure? Staying hydrated Follow instructions from your health care provider about hydration, which may include:  Up to 2 hours before the procedure - you may continue to drink clear liquids, such as water, clear fruit juice, black coffee, and plain tea.   Eating and drinking restrictions Follow instructions from your health care provider about eating and drinking, which may include:  8 hours before the procedure - stop eating heavy meals or foods, such as meat, fried foods, or fatty foods.  6 hours before the procedure - stop eating light meals or foods, such as toast or cereal.  6 hours before the procedure - stop drinking milk or drinks that contain milk.  2 hours before the procedure  - stop drinking clear liquids. Medicines Ask your health care provider about:  Changing or stopping your regular medicines. This is especially important if you are taking diabetes medicines or blood thinners.  Taking medicines such as aspirin and ibuprofen. These medicines can thin your blood. Do not take these medicines unless your health care provider tells you to take them.  Taking over-the-counter medicines, vitamins, herbs, and supplements. General instructions  Do not use any products that contain nicotine or tobacco for at least 4 weeks before the procedure, if possible. These products include cigarettes, chewing tobacco, and vaping devices, such as e-cigarettes. If you need help quitting, ask your health care provider.  Ask your health care provider: ? How your surgery site will be marked. ? What steps will be taken to help prevent infection. These steps may include:  Removing hair at the surgery site.  Washing skin with a germ-killing soap.  Taking antibiotic medicine.  Plan to have a responsible adult take you home from the hospital or clinic.  Plan to have a responsible adult care for you for the time you are told after you leave the hospital or clinic. This is important. What happens during the procedure?  An IV will be inserted into one of your veins.  You will be given one or more of the following: ? A medicine to help you relax (sedative). ? A medicine to make you fall asleep (general anesthetic).  Three small incisions will be made in your abdomen.  Your abdomen will be inflated with carbon dioxide gas to make the surgical area easier to see.  A laparoscope and surgical instruments will be inserted through the incisions. The laparoscope will send images of the inside of your abdomen to a monitor in the room.  Tissue that is bulging through the hernia may be removed or moved back into place.  The hernia opening will be closed with a sheet of surgical  mesh.  The surgical instruments and laparoscope will be removed.  Your incisions will be closed with stitches (sutures) and adhesive strips.  A bandage (dressing) will be placed over your incisions. The procedure may vary among health care providers and hospitals. What happens after the procedure?  Your blood pressure, heart rate, breathing rate, and blood oxygen level will be monitored until  you leave the hospital or clinic.  You will be given pain medicine as needed.  You may continue to receive medicines and fluids through an IV. The IV will be removed after you can drink fluids.  You will be encouraged to get up and move around and to take deep breaths frequently.  If you were given a sedative during the procedure, it can affect you for several hours. Do not drive or operate machinery until your health care provider says that it is safe. Summary  Laparoscopic inguinal hernia repair is a surgical procedure to repair a small, weak spot in the groin muscles that allows fat or intestines from inside the abdomen to bulge out (inguinal hernia).  This procedure is done through three small incisions in the abdomen. A thin tube with a light and camera on the end (laparoscope) is used to help perform the procedure.  After the procedure, you will be encouraged to get up and move around and to take deep breaths frequently. This information is not intended to replace advice given to you by your health care provider. Make sure you discuss any questions you have with your health care provider. Document Revised: 01/30/2020 Document Reviewed: 01/30/2020 Elsevier Patient Education  2021 [employer]

## 2020-11-03 ENCOUNTER — Encounter: Payer: Self-pay | Admitting: Surgery

## 2020-11-03 NOTE — Progress Notes (Signed)
11/01/2020  Reason for Visit:  Right inguinal hernia  Referring Provider:  Sharlot Gowda, MD  History of Present Illness: Bob Vargas is a 30 y.o. male presenting for evaluation of a right inguinal hernia.  The patient reports that he's had this for about a year, but has been worsening over the past month.  He reports that the job he does involves heavy lifting and he's noticed that at the end of the day, the right groin is more sore and swollen.  In the morning, the area is flatter.  When the hernia bulges, he tries to push on his groin and squats and is able to get the hernia reduced.  Denies any worsening pain, but the bulging is more visible and frequent.  It still is reducible.  Denies any fevers, chills, chest pain, shortness of breath, nausea, vomiting, constipation, diarrhea.  Of note, the patient does have a history of substance abuse and he reports that he's trying to find a rehab facility that he can go to.  Ideally, he would like to coordinate surgery with the same time as his rehab so that he would not need to take two separate times off from work.  Past Medical History: Past Medical History:  Diagnosis Date  . ADHD (attention deficit hyperactivity disorder)   . Allergy   . Headache(784.0)   . Hepatitis C   . MRSA infection (methicillin-resistant Staphylococcus aureus)    has had 3 times in past     Past Surgical History: Past Surgical History:  Procedure Laterality Date  . FOREIGN BODY REMOVAL Right 10/06/2013   Procedure: FOREIGN BODY REMOVAL ADULT right index finger;  Surgeon: Marlowe Shores, MD;  Location: [facility];  Service: Orthopedics;  Laterality: Right;  . KNEE ARTHROSCOPY Right   . KNEE SURGERY Right 07/06/2015  . MYRINGOTOMY      Home Medications: Prior to Admission medications   Medication Sig Start Date End Date Taking? Authorizing Provider  amphetamine-dextroamphetamine (ADDERALL) 20 MG tablet Take 1 tablet (20 mg total) by  mouth 2 (two) times daily. 10/14/20  Yes Ronnald Nian, MD  traZODone (DESYREL) 50 MG tablet TAKE 1 TABLET(50 MG) BY MOUTH AT BEDTIME AS NEEDED FOR SLEEP 10/14/20  Yes Ronnald Nian, MD    Allergies: No Known Allergies  Social History:  reports that he has been smoking cigarettes. He has been smoking about 0.25 packs per day. He has never used smokeless tobacco. He reports previous alcohol use of about 14.0 standard drinks of alcohol per week. He reports current drug use. Drug: IV.   Family History: Family History  Problem Relation Age of Onset  . Healthy Mother   . Hyperlipidemia Father   . Alcoholism Father     Review of Systems: Review of Systems  Constitutional: Negative for chills and fever.  HENT: Negative for hearing loss.   Respiratory: Negative for shortness of breath.   Cardiovascular: Negative for chest pain.  Gastrointestinal: Negative for abdominal pain, constipation, diarrhea, nausea and vomiting.  Genitourinary: Negative for dysuria.  Musculoskeletal: Negative for myalgias.  Skin: Negative for rash.  Neurological: Negative for dizziness.  Psychiatric/Behavioral: Negative for depression.    Physical Exam BP 126/73   Pulse 68   Temp 98.4 F (36.9 C) (Oral)   Ht 5\' 9"  (1.753 m)   Wt 177 lb 3.2 oz (80.4 kg)   SpO2 97%   BMI 26.17 kg/m  CONSTITUTIONAL: No acute distress HEENT:  Normocephalic, atraumatic, extraocular motion intact. NECK:  Trachea is midline, and there is no jugular venous distension.  RESPIRATORY:  Lungs are clear, and breath sounds are equal bilaterally. Normal respiratory effort without pathologic use of accessory muscles. CARDIOVASCULAR: Heart is regular without murmurs, gallops, or rubs. GI: The abdomen is soft, non-distended, with some discomfort to palpation in the right groin.  The patient has a reducible right inguinal hernia, small to medium size, reduces easily in both standing and supine position.  No hernia palpable on the left side.   No umbilical hernia.  MUSCULOSKELETAL:  Normal muscle strength and tone in all four extremities.  No peripheral edema or cyanosis. SKIN: Skin turgor is normal. There are no pathologic skin lesions.  NEUROLOGIC:  Motor and sensation is grossly normal.  Cranial nerves are grossly intact. PSYCH:  Alert and oriented to person, place and time. Affect is normal.  Laboratory Analysis: No results found for this or any previous visit (from the past 24 hour(s)).  Imaging: No results found.  Assessment and Plan: This is a 30 y.o. male with a right inguinal hernia.  --Discussed with the patient that indeed he has a right inguinal hernia.  Does not appear to have a left inguinal hernia or an umbilical hernia.  He is interested in surgery.  Discussed with him that there's no conservative/medical way to repair the hernia, and agree with surgery as being the option to follow.  He would like to try to coordinate surgery with time for his rehab, as he knows that with surgery he would not be able to do heavy lifting or pushing.  I discussed with him that if he could do lighter duties at work, he could go back after 1 week after surgery, but if he could only do heavy duties at work, would then have to wait 6 weeks to allow for the repair to heal well.  Discussed with him that he would be a candidate for a robotic right inguinal hernia repair, reviewed the surgery at length including risks of bleeding, infection, injury to surrounding structures, that this would be an outpatient surgery, that we could evaluate the left groin at the same time of surgery and if he happens to have a left inguinal hernia, this could be repaired at the same time.  I think it is reasonable to wait until he has rehab set up if he wants to try to do everything at once.  He is trying to do all this for around July. --Patient will contact us when he has a better time/date estimate so that we can schedule his surgery.  He understands that he would  need a follow up appointment towards the end of June to do H&P update.  All questions have been answered.  Return precautions given.  Face-to-face time spent with the patient and care providers was 80 minutes, with more than 50% of the time spent counseling, educating, and coordinating care of the patient.     [name], MD [facility]

## 2020-11-13 ENCOUNTER — Telehealth: Payer: Self-pay | Admitting: *Deleted

## 2020-11-13 NOTE — Telephone Encounter (Signed)
Patient called and wanted to know if we can go ahead and schedule surgery for July 12th with Dr Aleen Campi. He does not have a working phone number right now so he is going to call back tomorrow afternoon. He is aware to follow up with [place] on 12/13/20

## 2020-11-14 ENCOUNTER — Telehealth: Payer: Self-pay | Admitting: Surgery

## 2020-11-14 NOTE — Telephone Encounter (Signed)
Patient has been advised of Pre-Admission date/time, COVID Testing date and Surgery date.  Surgery Date: 12/24/20 Preadmission Testing Date: 12/17/20 (phone 8a-1p) Covid Testing Date: Not needed.    Patient has been made aware to call [phone], between 1-3:00pm the day before surgery, to find out what time to arrive for surgery.

## 2020-12-13 ENCOUNTER — Ambulatory Visit: Payer: BC Managed Care – PPO | Admitting: Surgery

## 2020-12-17 ENCOUNTER — Telehealth: Payer: Self-pay | Admitting: Urgent Care

## 2020-12-17 ENCOUNTER — Encounter: Payer: Self-pay | Admitting: Urgent Care

## 2020-12-17 ENCOUNTER — Telehealth: Payer: Self-pay | Admitting: Surgery

## 2020-12-17 ENCOUNTER — Other Ambulatory Visit: Admission: RE | Admit: 2020-12-17 | Payer: BC Managed Care – PPO | Source: Ambulatory Visit

## 2020-12-17 NOTE — Telephone Encounter (Signed)
Outgoing call is made, left message for mom, Bob Vargas to call me at the request of her son, Bob Vargas.  Please advise of Pre-Admission date/time, COVID Testing date and Surgery date.  Surgery Date: 01/02/21 Preadmission Testing Date: 12/23/20 (phone 1p-5p) Covid Testing Date: Not needed.    Also patient to call at [phone], between 1-3:00pm the day before surgery, to find out what time to arrive for surgery.

## 2020-12-17 NOTE — Progress Notes (Signed)
  Perioperative Services Pre-Admission/Anesthesia Testing    Date: 12/17/20  Name: Bob Vargas MRN:   [id number]  Re: Plans for upcoming surgery  Planned Surgical Procedure(s):   Patient scheduled for the above procedure on 12/24/2020. In preparation for his procedure, patient was scheduled to follow-up with general surgery Aleen Campi, MD) on 12/13/2020, however he did not attend that appointment.  PAT staff reached out to patient on the morning of 12/17/2020 for presurgical interview, however patient unavailable.  PAT RN spoke with patient's mother who advised that patient currently and a drug rehabilitation program and does not get out until 12/21/2020. Per mother's report, patient still has plans to have surgery as currently scheduled on 12/24/2020.  In review of the clinical documentation, patient was seen at [facility] on 11/13/2020.  At that time, patient presented to the ED from rehabilitation facility with clinical signs of drug overdose. Patient admitted to clonazepam, ETOH, and fentanyl use. There was no suicidal ideation or intent. Review of past drug screens also indicated the use of marijuana. Patient was monitored and ultimately discharged back to the facility for ongoing rehabilitation efforts.   Communication sent to primary attending surgeons office to make them aware of the above. Additionally, [facility] EMR updated with this clinical information to ensure comprehensive review by all providers involved in the care of this patient. Dr. Aleen Campi with plans to see patient in the office on 12/25/2020, and if doing well, proceed with surgery on  01/02/2021. Surgery office staff to contact patient/family to discuss. Of note, UDS has been ordered for the day of the patient's procedure. Based on results of this testing, surgeon and/or anesthesia may elect to cancel/postpone the case in efforts to ensure safe and effective clinical course/outcome.  Quentin Mulling, MSN,  APRN, FNP-C, CEN [facility]  Peri-operative Services Nurse Practitioner Phone: [phone] 12/17/20 11:02 AM

## 2020-12-23 ENCOUNTER — Encounter
Admission: RE | Admit: 2020-12-23 | Discharge: 2020-12-23 | Disposition: A | Discharge status: Home / Self Care | Payer: BC Managed Care – PPO | Source: Ambulatory Visit | Attending: Surgery | Admitting: Surgery

## 2020-12-23 ENCOUNTER — Other Ambulatory Visit: Payer: Self-pay

## 2020-12-23 ENCOUNTER — Ambulatory Visit: Payer: BC Managed Care – PPO | Admitting: Surgery

## 2020-12-23 NOTE — Patient Instructions (Addendum)
Your procedure is scheduled on: 01/02/21 Report to DAY SURGERY DEPARTMENT LOCATED ON 2ND FLOOR MEDICAL MALL ENTRANCE. To find out your arrival time please call [phone] between 1PM - 3PM on 01/01/21.  Remember: Instructions that are not followed completely may result in serious medical risk, up to and including death, or upon the discretion of your surgeon and anesthesiologist your surgery may need to be rescheduled.     _X__ 1. Do not eat food after midnight the night before your procedure.                 No gum chewing or hard candies. You may drink clear liquids up to 2 hours                 before you are scheduled to arrive for your surgery- DO not drink clear                 liquids within 2 hours of the start of your surgery.                 Clear Liquids include:  water, apple juice without pulp, clear carbohydrate                 drink such as Clearfast or Gatorade, Black Coffee or Tea (Do not add                 anything to coffee or tea). Diabetics water only  __X__2.  On the morning of surgery brush your teeth with toothpaste and water, you                 may rinse your mouth with mouthwash if you wish.  Do not swallow any              toothpaste of mouthwash.     _X__ 3.  No Alcohol for 24 hours before or after surgery.   _X__ 4.  Do Not Smoke or use e-cigarettes For 24 Hours Prior to Your Surgery.                 Do not use any chewable tobacco products for at least 6 hours prior to                 surgery.  ____  5.  Bring all medications with you on the day of surgery if instructed.   __X__  6.  Notify your doctor if there is any change in your medical condition      (cold, fever, infections).     Do not wear jewelry, make-up, hairpins, clips or nail polish. Do not wear lotions, powders, or perfumes.  Do not shave 48 hours prior to surgery. Men may shave face and neck. Do not bring valuables to the hospital.    [facility] is not responsible for any belongings or  valuables.  Contacts, dentures/partials or body piercings may not be worn into surgery. Bring a case for your contacts, glasses or hearing aids, a denture cup will be supplied. Leave your suitcase in the car. After surgery it may be brought to your room. For patients admitted to the hospital, discharge time is determined by your treatment team.   Patients discharged the day of surgery will not be allowed to drive home.   Please read over the following fact sheets that you were given:   MRSA Information, CHG soap  __X__ Take these medicines the morning of surgery with A SIP OF WATER:  1. none  2.   3.   4.  5.  6.  ____ Fleet Enema (as directed)   __X__ Use CHG Soap/SAGE wipes as directed  ____ Use inhalers on the day of surgery  ____ Stop metformin/Janumet/Farxiga 2 days prior to surgery    ____ Take 1/2 of usual insulin dose the night before surgery. No insulin the morning          of surgery.   ____ Stop Blood Thinners Coumadin/Plavix/Xarelto/Pleta/Pradaxa/Eliquis/Effient/Aspirin  on    Or contact your Surgeon, Cardiologist or Medical Doctor regarding  ability to stop your blood thinners  __X__ Stop Anti-inflammatories 7 days before surgery such as Advil, Ibuprofen, Motrin,  BC or Goodies Powder, Naprosyn, Naproxen, Aleve, Aspirin    __X__ Stop all herbal supplements, fish oil or vitamin E until after surgery.    ____ Bring C-Pap to the hospital.

## 2020-12-23 NOTE — Telephone Encounter (Signed)
Outgoing call is made.  Patient is now aware of all information and dates regarding his rescheduled surgery.  Patient is also reminded of his follow up in office with Dr. Aleen Campi.  Patient verbalized understanding.

## 2020-12-25 ENCOUNTER — Other Ambulatory Visit: Payer: Self-pay

## 2020-12-25 ENCOUNTER — Encounter: Admission: RE | Admit: 2020-12-25 | Payer: BC Managed Care – PPO | Source: Ambulatory Visit

## 2020-12-25 ENCOUNTER — Ambulatory Visit: Payer: BC Managed Care – PPO | Admitting: Surgery

## 2020-12-25 ENCOUNTER — Encounter: Payer: Self-pay | Admitting: Surgery

## 2020-12-25 ENCOUNTER — Ambulatory Visit (INDEPENDENT_AMBULATORY_CARE_PROVIDER_SITE_OTHER): Payer: BC Managed Care – PPO | Admitting: Surgery

## 2020-12-25 VITALS — BP 146/89 | HR 94 | Temp 98.6°F | Resp 12 | Ht 69.0 in | Wt 191.0 lb

## 2020-12-25 DIAGNOSIS — K4090 Unilateral inguinal hernia, without obstruction or gangrene, not specified as recurrent: Secondary | ICD-10-CM

## 2020-12-25 NOTE — Progress Notes (Signed)
12/25/2020  History of Present Illness: Bob Vargas is a 30 y.o. male presenting for follow up of a right inguinal hernia.  He was last seen on 11/01/20 for the same and is currently scheduled for robotic right inguinal hernia repair on 01/02/21.  He was recently at an inpatient rehab facility for drug abuse.  He has completed his rehab and currently reports feeling well.  Denies any worsening pain in the right groin.  Denies any new pain in left groin or umbilicus.    Past Medical History: Past Medical History:  Diagnosis Date   ADHD (attention deficit hyperactivity disorder)    Allergy    Headache(784.0)    Hepatitis C    per pt cleared on its own   MRSA infection (methicillin-resistant Staphylococcus aureus)    has had 3 times in past   Polysubstance abuse (HCC)    BZO, ETOH, marijuana, opioids     Past Surgical History: Past Surgical History:  Procedure Laterality Date   FOREIGN BODY REMOVAL Right 10/06/2013   Procedure: FOREIGN BODY REMOVAL ADULT right index finger;  Surgeon: Marlowe Shores, MD;  Location: [facility];  Service: Orthopedics;  Laterality: Right;   KNEE ARTHROSCOPY Right    KNEE SURGERY Right 07/06/2015   MYRINGOTOMY      Home Medications: Prior to Admission medications   Medication Sig Start Date End Date Taking? Authorizing Provider  escitalopram (LEXAPRO) 10 MG tablet Take 10 mg by mouth at bedtime.   Yes [provider]  hydrOXYzine (ATARAX/VISTARIL) 50 MG tablet Take 50 mg by mouth 3 (three) times daily as needed.   Yes [provider]  methocarbamol (ROBAXIN) 500 MG tablet Take 500 mg by mouth 3 (three) times daily as needed for muscle spasms.   Yes [provider]  pramipexole (MIRAPEX) 0.125 MG tablet Take 0.125 mg by mouth at bedtime.   Yes [provider]  QUEtiapine (SEROQUEL) 200 MG tablet Take 200 mg by mouth.   Yes [provider]  amphetamine-dextroamphetamine (ADDERALL) 20 MG  tablet Take 1 tablet (20 mg total) by mouth 2 (two) times daily. Patient not taking: No sig reported 10/14/20   Ronnald Nian, MD  traZODone (DESYREL) 50 MG tablet TAKE 1 TABLET(50 MG) BY MOUTH AT BEDTIME AS NEEDED FOR SLEEP Patient not taking: No sig reported 10/14/20   Ronnald Nian, MD    Allergies: No Known Allergies  Review of Systems: Review of Systems  Constitutional:  Negative for chills and fever.  Respiratory:  Negative for shortness of breath.   Cardiovascular:  Negative for chest pain.  Gastrointestinal:  Negative for abdominal pain, constipation, diarrhea, nausea and vomiting.   Physical Exam BP (!) 146/89   Pulse 94   Temp 98.6 F (37 C) (Oral)   Resp 12   Ht 5\' 9"  (1.753 m)   Wt 191 lb (86.6 kg)   SpO2 93%   BMI 28.21 kg/m  CONSTITUTIONAL: No acute distress, well nourished. HEENT:  Normocephalic, atraumatic, extraocular motion intact. RESPIRATORY:  Lungs are clear, and breath sounds are equal bilaterally. Normal respiratory effort without pathologic use of accessory muscles. CARDIOVASCULAR: Heart is regular without murmurs, gallops, or rubs. GI: The abdomen is soft, non-distended, non-tender.  Patient has a right inguinal hernia which is reducible and without significant tenderness to palpation.  No hernia on the left groin and no umbilical hernia.  NEUROLOGIC:  Motor and sensation is grossly normal.  Cranial nerves are grossly intact. PSYCH:  Alert  and oriented to person, place and time. Affect is normal.  Labs/Imaging: None new  Assessment and Plan: This is a 30 y.o. male with right inguinal hernia.  --Discussed again with the patient the proposed robotic assisted right inguinal hernia repair.  Reviewed with him the surgery at length including risks of bleeding, infection, and injury to surrounding structures.  This would be an outpatient surgery.  Given his history of drug abuse and his recent rehab stay, I think it would also be good to avoid narcotics  post-operatively.  Discussed with him the role for ibuprofen and gabapentin, and that we'd use Exparel for local anesthetic.  He is willing to proceed.  He is scheduled for 01/02/21.  Face-to-face time spent with the patient and care providers was 25 minutes, with more than 50% of the time spent counseling, educating, and coordinating care of the patient.     [name], MD [facility]

## 2020-12-25 NOTE — H&P (View-Only) (Signed)
12/25/2020  History of Present Illness: Bob Vargas is a 30 y.o. male presenting for follow up of a right inguinal hernia.  He was last seen on 11/01/20 for the same and is currently scheduled for robotic right inguinal hernia repair on 01/02/21.  He was recently at an inpatient rehab facility for drug abuse.  He has completed his rehab and currently reports feeling well.  Denies any worsening pain in the right groin.  Denies any new pain in left groin or umbilicus.    Past Medical History: Past Medical History:  Diagnosis Date   ADHD (attention deficit hyperactivity disorder)    Allergy    Headache(784.0)    Hepatitis C    per pt cleared on its own   MRSA infection (methicillin-resistant Staphylococcus aureus)    has had 3 times in past   Polysubstance abuse (HCC)    BZO, ETOH, marijuana, opioids     Past Surgical History: Past Surgical History:  Procedure Laterality Date   FOREIGN BODY REMOVAL Right 10/06/2013   Procedure: FOREIGN BODY REMOVAL ADULT right index finger;  Surgeon: Marlowe Shores, MD;  Location: [facility];  Service: Orthopedics;  Laterality: Right;   KNEE ARTHROSCOPY Right    KNEE SURGERY Right 07/06/2015   MYRINGOTOMY      Home Medications: Prior to Admission medications   Medication Sig Start Date End Date Taking? Authorizing Provider  escitalopram (LEXAPRO) 10 MG tablet Take 10 mg by mouth at bedtime.   Yes [provider]  hydrOXYzine (ATARAX/VISTARIL) 50 MG tablet Take 50 mg by mouth 3 (three) times daily as needed.   Yes [provider]  methocarbamol (ROBAXIN) 500 MG tablet Take 500 mg by mouth 3 (three) times daily as needed for muscle spasms.   Yes [provider]  pramipexole (MIRAPEX) 0.125 MG tablet Take 0.125 mg by mouth at bedtime.   Yes [provider]  QUEtiapine (SEROQUEL) 200 MG tablet Take 200 mg by mouth.   Yes [provider]  amphetamine-dextroamphetamine (ADDERALL) 20 MG  tablet Take 1 tablet (20 mg total) by mouth 2 (two) times daily. Patient not taking: No sig reported 10/14/20   Ronnald Nian, MD  traZODone (DESYREL) 50 MG tablet TAKE 1 TABLET(50 MG) BY MOUTH AT BEDTIME AS NEEDED FOR SLEEP Patient not taking: No sig reported 10/14/20   Ronnald Nian, MD    Allergies: No Known Allergies  Review of Systems: Review of Systems  Constitutional:  Negative for chills and fever.  Respiratory:  Negative for shortness of breath.   Cardiovascular:  Negative for chest pain.  Gastrointestinal:  Negative for abdominal pain, constipation, diarrhea, nausea and vomiting.   Physical Exam BP (!) 146/89   Pulse 94   Temp 98.6 F (37 C) (Oral)   Resp 12   Ht 5\' 9"  (1.753 m)   Wt 191 lb (86.6 kg)   SpO2 93%   BMI 28.21 kg/m  CONSTITUTIONAL: No acute distress, well nourished. HEENT:  Normocephalic, atraumatic, extraocular motion intact. RESPIRATORY:  Lungs are clear, and breath sounds are equal bilaterally. Normal respiratory effort without pathologic use of accessory muscles. CARDIOVASCULAR: Heart is regular without murmurs, gallops, or rubs. GI: The abdomen is soft, non-distended, non-tender.  Patient has a right inguinal hernia which is reducible and without significant tenderness to palpation.  No hernia on the left groin and no umbilical hernia.  NEUROLOGIC:  Motor and sensation is grossly normal.  Cranial nerves are grossly intact. PSYCH:  Alert  and oriented to person, place and time. Affect is normal.  Labs/Imaging: None new  Assessment and Plan: This is a 30 y.o. male with right inguinal hernia.  --Discussed again with the patient the proposed robotic assisted right inguinal hernia repair.  Reviewed with him the surgery at length including risks of bleeding, infection, and injury to surrounding structures.  This would be an outpatient surgery.  Given his history of drug abuse and his recent rehab stay, I think it would also be good to avoid narcotics  post-operatively.  Discussed with him the role for ibuprofen and gabapentin, and that we'd use Exparel for local anesthetic.  He is willing to proceed.  He is scheduled for 01/02/21.  Face-to-face time spent with the patient and care providers was 25 minutes, with more than 50% of the time spent counseling, educating, and coordinating care of the patient.     Nary Sneed Luis Arnell Slivinski, MD [facility]  

## 2020-12-25 NOTE — Patient Instructions (Addendum)
If you have any concerns or questions, please feel free to call our office.   Laparoscopic Inguinal Hernia Repair, Adult  Laparoscopic inguinal hernia repair is a surgical procedure to repair a small, weak spot in the groin muscles that allows fat or intestines from inside the abdomen to bulge out (inguinal hernia). This procedure may be planned, or it may be an emergency procedure. During the procedure, tissue that has bulged out is moved back into place, and the opening in the groin muscles is repaired. This is done through three small incisions in the abdomen. A thin tube with a light and camera on the end (laparoscope) is used to help perform the procedure. Tell a health care provider about: Any allergies you have. All medicines you are taking, including vitamins, herbs, eye drops, creams, and over-the-counter medicines. Any problems you or family members have had with anesthetic medicines. Any blood disorders you have. Any surgeries you have had. Any medical conditions you have. Whether you are pregnant or may be pregnant. What are the risks? Generally, this is a safe procedure. However, problems may occur, including: Infection. Bleeding. Allergic reactions to medicines. Damage to nearby structures or organs. Testicle damage or long-term pain and swelling of the scrotum, in males. Inability to completely empty the bladder (urinary retention). Blood clots. A collection of fluid that builds up under the skin (seroma). The hernia coming back (recurrence). What happens before the procedure? Staying hydrated Follow instructions from your health care provider about hydration, which may include: Up to 2 hours before the procedure - you may continue to drink clear liquids, such as water, clear fruit juice, black coffee, and plain tea.  Eating and drinking restrictions Follow instructions from your health care provider about eating and drinking, which may include: 8 hours before the  procedure - stop eating heavy meals or foods, such as meat, fried foods, or fatty foods. 6 hours before the procedure - stop eating light meals or foods, such as toast or cereal. 6 hours before the procedure - stop drinking milk or drinks that contain milk. 2 hours before the procedure - stop drinking clear liquids. Medicines Ask your health care provider about: Changing or stopping your regular medicines. This is especially important if you are taking diabetes medicines or blood thinners. Taking medicines such as aspirin and ibuprofen. These medicines can thin your blood. Do not take these medicines unless your health care provider tells you to take them. Taking over-the-counter medicines, vitamins, herbs, and supplements. General instructions Do not use any products that contain nicotine or tobacco for at least 4 weeks before the procedure, if possible. These products include cigarettes, chewing tobacco, and vaping devices, such as e-cigarettes. If you need help quitting, ask your health care provider. Ask your health care provider: How your surgery site will be marked. What steps will be taken to help prevent infection. These steps may include: Removing hair at the surgery site. Washing skin with a germ-killing soap. Taking antibiotic medicine. Plan to have a responsible adult take you home from the hospital or clinic. Plan to have a responsible adult care for you for the time you are told after you leave the hospital or clinic. This is important. What happens during the procedure? An IV will be inserted into one of your veins. You will be given one or more of the following: A medicine to help you relax (sedative). A medicine to make you fall asleep (general anesthetic). Three small incisions will be made in your abdomen.  Your abdomen will be inflated with carbon dioxide gas to make the surgical area easier to see. A laparoscope and surgical instruments will be inserted through the  incisions. The laparoscope will send images of the inside of your abdomen to a monitor in the room. Tissue that is bulging through the hernia may be removed or moved back into place. The hernia opening will be closed with a sheet of surgical mesh. The surgical instruments and laparoscope will be removed. Your incisions will be closed with stitches (sutures) and adhesive strips. A bandage (dressing) will be placed over your incisions. The procedure may vary among health care providers and hospitals. What happens after the procedure? Your blood pressure, heart rate, breathing rate, and blood oxygen level will be monitored until you leave the hospital or clinic. You will be given pain medicine as needed. You may continue to receive medicines and fluids through an IV. The IV will be removed after you can drink fluids. You will be encouraged to get up and move around and to take deep breaths frequently. If you were given a sedative during the procedure, it can affect you for several hours. Do not drive or operate machinery until your health care provider says that it is safe. Summary Laparoscopic inguinal hernia repair is a surgical procedure to repair a small, weak spot in the groin muscles that allows fat or intestines from inside the abdomen to bulge out (inguinal hernia). This procedure is done through three small incisions in the abdomen. A thin tube with a light and camera on the end (laparoscope) is used to help perform the procedure. After the procedure, you will be encouraged to get up and move around and to take deep breaths frequently. This information is not intended to replace advice given to you by your health care provider. Make sure you discuss any questions you have with your healthcare provider. Document Revised: 01/30/2020 Document Reviewed: 01/30/2020 Elsevier Patient Education  2022 [employer].

## 2021-01-01 MED ORDER — CHLORHEXIDINE GLUCONATE CLOTH 2 % EX PADS: 6.0000 | MEDICATED_PAD | Freq: Once | CUTANEOUS | Status: DC

## 2021-01-01 MED ORDER — ACETAMINOPHEN 500 MG PO TABS
1000.0000 mg | ORAL_TABLET | ORAL | Status: AC
  Administered 2021-01-02: 1000 mg via ORAL

## 2021-01-01 MED ORDER — GABAPENTIN 300 MG PO CAPS
300.0000 mg | ORAL_CAPSULE | ORAL | Status: AC
  Administered 2021-01-02: 300 mg via ORAL

## 2021-01-01 MED ORDER — CEFAZOLIN SODIUM-DEXTROSE 2-4 GM/100ML-% IV SOLN
2.0000 g | INTRAVENOUS | Status: AC
  Administered 2021-01-02: 2 g via INTRAVENOUS

## 2021-01-01 MED ORDER — LACTATED RINGERS IV SOLN: INTRAVENOUS | Status: DC

## 2021-01-01 MED ORDER — CHLORHEXIDINE GLUCONATE 0.12 % MT SOLN
15.0000 mL | Freq: Once | OROMUCOSAL | Status: AC
  Administered 2021-01-02: 15 mL via OROMUCOSAL

## 2021-01-01 MED ORDER — FAMOTIDINE 20 MG PO TABS
20.0000 mg | ORAL_TABLET | Freq: Once | ORAL | Status: AC
  Administered 2021-01-02: 20 mg via ORAL

## 2021-01-01 MED ORDER — ORAL CARE MOUTH RINSE: 15.0000 mL | Freq: Once | OROMUCOSAL | Status: AC

## 2021-01-01 MED ORDER — BUPIVACAINE LIPOSOME 1.3 % IJ SUSP: 20.0000 mL | Freq: Once | INTRAMUSCULAR | Status: DC

## 2021-01-02 ENCOUNTER — Ambulatory Visit: Payer: BC Managed Care – PPO | Admitting: Urgent Care

## 2021-01-02 ENCOUNTER — Ambulatory Visit
Admission: RE | Admit: 2021-01-02 | Discharge: 2021-01-02 | Disposition: A | Discharge status: Home / Self Care | Payer: BC Managed Care – PPO | Attending: Surgery | Admitting: Surgery

## 2021-01-02 ENCOUNTER — Encounter
Admission: RE | Disposition: A | Discharge status: Home / Self Care | Payer: Self-pay | Source: Home / Self Care | Attending: Surgery

## 2021-01-02 ENCOUNTER — Encounter: Payer: Self-pay | Admitting: Surgery

## 2021-01-02 ENCOUNTER — Other Ambulatory Visit: Payer: Self-pay

## 2021-01-02 DIAGNOSIS — K4090 Unilateral inguinal hernia, without obstruction or gangrene, not specified as recurrent: Secondary | ICD-10-CM

## 2021-01-02 DIAGNOSIS — Z79899 Other long term (current) drug therapy: Secondary | ICD-10-CM | POA: Insufficient documentation

## 2021-01-02 HISTORY — PX: XI ROBOTIC ASSISTED INGUINAL HERNIA REPAIR WITH MESH: SHX6706

## 2021-01-02 LAB — URINE DRUG SCREEN, QUALITATIVE (ARMC ONLY)
Amphetamines, Ur Screen: NOT DETECTED
Barbiturates, Ur Screen: NOT DETECTED
Benzodiazepine, Ur Scrn: NOT DETECTED
Cannabinoid 50 Ng, Ur ~~LOC~~: NOT DETECTED
Cocaine Metabolite,Ur ~~LOC~~: NOT DETECTED
MDMA (Ecstasy)Ur Screen: NOT DETECTED
Methadone Scn, Ur: NOT DETECTED
Opiate, Ur Screen: NOT DETECTED
Phencyclidine (PCP) Ur S: NOT DETECTED
Tricyclic, Ur Screen: POSITIVE — AB

## 2021-01-02 SURGERY — REPAIR, HERNIA, INGUINAL, ROBOT-ASSISTED, LAPAROSCOPIC, USING MESH
Anesthesia: General | Laterality: Right

## 2021-01-02 MED ORDER — MIDAZOLAM HCL 2 MG/2ML IJ SOLN
INTRAMUSCULAR | Status: DC | PRN
  Administered 2021-01-02: 2 mg via INTRAVENOUS

## 2021-01-02 MED ORDER — KETOROLAC TROMETHAMINE 30 MG/ML IJ SOLN
INTRAMUSCULAR | Status: DC | PRN
  Administered 2021-01-02: 30 mg via INTRAVENOUS

## 2021-01-02 MED ORDER — KETOROLAC TROMETHAMINE 30 MG/ML IJ SOLN
INTRAMUSCULAR | Status: AC
  Filled 2021-01-02: qty 1

## 2021-01-02 MED ORDER — GABAPENTIN 300 MG PO CAPS
ORAL_CAPSULE | ORAL | Status: AC
  Filled 2021-01-02: qty 1

## 2021-01-02 MED ORDER — OXYCODONE HCL 5 MG PO TABS
ORAL_TABLET | ORAL | Status: AC
  Filled 2021-01-02: qty 2

## 2021-01-02 MED ORDER — OXYCODONE HCL 5 MG/5ML PO SOLN
5.0000 mg | Freq: Once | ORAL | Status: AC | PRN
Start: 2021-01-02 — End: 2021-01-02

## 2021-01-02 MED ORDER — DEXMEDETOMIDINE (PRECEDEX) IN NS 20 MCG/5ML (4 MCG/ML) IV SYRINGE
PREFILLED_SYRINGE | INTRAVENOUS | Status: AC
  Filled 2021-01-02: qty 10

## 2021-01-02 MED ORDER — ONDANSETRON HCL 4 MG/2ML IJ SOLN
INTRAMUSCULAR | Status: DC | PRN
  Administered 2021-01-02: 4 mg via INTRAVENOUS

## 2021-01-02 MED ORDER — MIDAZOLAM HCL 2 MG/2ML IJ SOLN
INTRAMUSCULAR | Status: AC
  Filled 2021-01-02: qty 2

## 2021-01-02 MED ORDER — FENTANYL CITRATE (PF) 100 MCG/2ML IJ SOLN
INTRAMUSCULAR | Status: AC
  Administered 2021-01-02: 50 ug via INTRAVENOUS
  Filled 2021-01-02: qty 2

## 2021-01-02 MED ORDER — LIDOCAINE HCL 4 % MT SOLN
OROMUCOSAL | Status: DC | PRN
  Administered 2021-01-02: 4 mL via TOPICAL

## 2021-01-02 MED ORDER — LIDOCAINE HCL (PF) 2 % IJ SOLN
INTRAMUSCULAR | Status: AC
  Filled 2021-01-02: qty 5

## 2021-01-02 MED ORDER — FAMOTIDINE 20 MG PO TABS
ORAL_TABLET | ORAL | Status: AC
  Filled 2021-01-02: qty 1

## 2021-01-02 MED ORDER — ROCURONIUM BROMIDE 10 MG/ML (PF) SYRINGE
PREFILLED_SYRINGE | INTRAVENOUS | Status: AC
  Filled 2021-01-02: qty 10

## 2021-01-02 MED ORDER — CEFAZOLIN SODIUM-DEXTROSE 2-4 GM/100ML-% IV SOLN
INTRAVENOUS | Status: AC
  Filled 2021-01-02: qty 100

## 2021-01-02 MED ORDER — SUGAMMADEX SODIUM 200 MG/2ML IV SOLN
INTRAVENOUS | Status: DC | PRN
  Administered 2021-01-02: 200 mg via INTRAVENOUS

## 2021-01-02 MED ORDER — KETAMINE HCL 50 MG/ML IJ SOLN
INTRAMUSCULAR | Status: AC
  Filled 2021-01-02: qty 10

## 2021-01-02 MED ORDER — KETAMINE HCL 10 MG/ML IJ SOLN
INTRAMUSCULAR | Status: DC | PRN
  Administered 2021-01-02: 20 mg via INTRAVENOUS
  Administered 2021-01-02: 50 mg via INTRAVENOUS

## 2021-01-02 MED ORDER — PROPOFOL 10 MG/ML IV BOLUS
INTRAVENOUS | Status: AC
  Filled 2021-01-02: qty 20

## 2021-01-02 MED ORDER — OXYCODONE HCL 5 MG PO TABS
10.0000 mg | ORAL_TABLET | Freq: Once | ORAL | Status: AC | PRN
  Administered 2021-01-02: 10 mg via ORAL

## 2021-01-02 MED ORDER — SUCCINYLCHOLINE CHLORIDE 200 MG/10ML IV SOSY
PREFILLED_SYRINGE | INTRAVENOUS | Status: AC
  Filled 2021-01-02: qty 10

## 2021-01-02 MED ORDER — FENTANYL CITRATE (PF) 100 MCG/2ML IJ SOLN
25.0000 ug | INTRAMUSCULAR | Status: DC | PRN
  Administered 2021-01-02 (×2): 50 ug via INTRAVENOUS

## 2021-01-02 MED ORDER — ACETAMINOPHEN 500 MG PO TABS
ORAL_TABLET | ORAL | Status: AC
  Filled 2021-01-02: qty 2

## 2021-01-02 MED ORDER — BUPIVACAINE LIPOSOME 1.3 % IJ SUSP
INTRAMUSCULAR | Status: DC | PRN
  Administered 2021-01-02: 20 mL

## 2021-01-02 MED ORDER — DEXMEDETOMIDINE HCL 200 MCG/2ML IV SOLN
INTRAVENOUS | Status: DC | PRN
  Administered 2021-01-02: 12 ug via INTRAVENOUS
  Administered 2021-01-02: 8 ug via INTRAVENOUS

## 2021-01-02 MED ORDER — FENTANYL CITRATE (PF) 250 MCG/5ML IJ SOLN
INTRAMUSCULAR | Status: AC
  Filled 2021-01-02: qty 5

## 2021-01-02 MED ORDER — BUPIVACAINE-EPINEPHRINE 0.25% -1:200000 IJ SOLN
INTRAMUSCULAR | Status: DC | PRN
  Administered 2021-01-02: 30 mL

## 2021-01-02 MED ORDER — ACETAMINOPHEN 500 MG PO TABS: 1000.0000 mg | ORAL_TABLET | Freq: Four times a day (QID) | ORAL | Status: DC | PRN

## 2021-01-02 MED ORDER — CHLORHEXIDINE GLUCONATE 0.12 % MT SOLN
OROMUCOSAL | Status: AC
  Filled 2021-01-02: qty 15

## 2021-01-02 MED ORDER — 0.9 % SODIUM CHLORIDE (POUR BTL) OPTIME
TOPICAL | Status: DC | PRN
  Administered 2021-01-02: 5 mL

## 2021-01-02 MED ORDER — PROPOFOL 10 MG/ML IV BOLUS
INTRAVENOUS | Status: DC | PRN
  Administered 2021-01-02: 300 mg via INTRAVENOUS

## 2021-01-02 MED ORDER — CYCLOBENZAPRINE HCL 7.5 MG PO TABS
7.5000 mg | ORAL_TABLET | Freq: Three times a day (TID) | ORAL | 0 refills | Status: DC | PRN
Start: 2021-01-02 — End: 2021-01-17

## 2021-01-02 MED ORDER — GABAPENTIN 300 MG PO CAPS: 300.0000 mg | ORAL_CAPSULE | Freq: Three times a day (TID) | ORAL | 0 refills | Status: DC

## 2021-01-02 MED ORDER — DEXAMETHASONE SODIUM PHOSPHATE 10 MG/ML IJ SOLN
INTRAMUSCULAR | Status: DC | PRN
  Administered 2021-01-02: 10 mg via INTRAVENOUS

## 2021-01-02 MED ORDER — FENTANYL CITRATE (PF) 100 MCG/2ML IJ SOLN
INTRAMUSCULAR | Status: DC | PRN
  Administered 2021-01-02: 150 ug via INTRAVENOUS
  Administered 2021-01-02: 100 ug via INTRAVENOUS

## 2021-01-02 MED ORDER — IBUPROFEN 800 MG PO TABS: 800.0000 mg | ORAL_TABLET | Freq: Three times a day (TID) | ORAL | 0 refills | Status: DC | PRN

## 2021-01-02 MED ORDER — ONDANSETRON HCL 4 MG/2ML IJ SOLN
INTRAMUSCULAR | Status: AC
  Filled 2021-01-02: qty 2

## 2021-01-02 MED ORDER — ROCURONIUM BROMIDE 100 MG/10ML IV SOLN
INTRAVENOUS | Status: DC | PRN
  Administered 2021-01-02: 20 mg via INTRAVENOUS
  Administered 2021-01-02: 30 mg via INTRAVENOUS
  Administered 2021-01-02: 50 mg via INTRAVENOUS

## 2021-01-02 MED ORDER — LIDOCAINE HCL (CARDIAC) PF 100 MG/5ML IV SOSY
PREFILLED_SYRINGE | INTRAVENOUS | Status: DC | PRN
  Administered 2021-01-02: 100 mg via INTRAVENOUS

## 2021-01-02 MED ORDER — DEXAMETHASONE SODIUM PHOSPHATE 10 MG/ML IJ SOLN
INTRAMUSCULAR | Status: AC
  Filled 2021-01-02: qty 1

## 2021-01-02 MED ORDER — PROPOFOL 10 MG/ML IV BOLUS
INTRAVENOUS | Status: AC
  Filled 2021-01-02: qty 40

## 2021-01-02 MED ORDER — OXYCODONE HCL ER 10 MG PO T12A
EXTENDED_RELEASE_TABLET | ORAL | Status: AC
  Filled 2021-01-02: qty 1

## 2021-01-02 SURGICAL SUPPLY — 61 items
ADH SKN CLS APL DERMABOND .7 (GAUZE/BANDAGES/DRESSINGS) ×1
APL PRP STRL LF DISP 70% ISPRP (MISCELLANEOUS) ×1
CANISTER SUCT 1200ML W/VALVE (MISCELLANEOUS) ×1 IMPLANT
CANNULA REDUC XI 12-8 STAPL (CANNULA) ×2
CANNULA REDUCER 12-8 DVNC XI (CANNULA) ×1 IMPLANT
CHLORAPREP W/TINT 26 (MISCELLANEOUS) ×2 IMPLANT
COVER TIP SHEARS 8 DVNC (MISCELLANEOUS) ×1 IMPLANT
COVER TIP SHEARS 8MM DA VINCI (MISCELLANEOUS) ×2
COVER WAND RF STERILE (DRAPES) ×2 IMPLANT
DEFOGGER SCOPE WARMER CLEARIFY (MISCELLANEOUS) ×2 IMPLANT
DERMABOND ADVANCED (GAUZE/BANDAGES/DRESSINGS) ×1
DERMABOND ADVANCED .7 DNX12 (GAUZE/BANDAGES/DRESSINGS) ×1 IMPLANT
DRAPE ARM DVNC X/XI (DISPOSABLE) ×4 IMPLANT
DRAPE COLUMN DVNC XI (DISPOSABLE) ×1 IMPLANT
DRAPE DA VINCI XI ARM (DISPOSABLE) ×8
DRAPE DA VINCI XI COLUMN (DISPOSABLE) ×2
ELECT CAUTERY BLADE TIP 2.5 (TIP) ×2
ELECT REM PT RETURN 9FT ADLT (ELECTROSURGICAL) ×2
ELECTRODE CAUTERY BLDE TIP 2.5 (TIP) ×1 IMPLANT
ELECTRODE REM PT RTRN 9FT ADLT (ELECTROSURGICAL) ×1 IMPLANT
GAUZE 4X4 16PLY ~~LOC~~+RFID DBL (SPONGE) ×2 IMPLANT
GLOVE SURG SYN 7.0 (GLOVE) ×10 IMPLANT
GLOVE SURG SYN 7.0 PF PI (GLOVE) ×2 IMPLANT
GLOVE SURG SYN 7.5  E (GLOVE) ×10
GLOVE SURG SYN 7.5 E (GLOVE) ×5 IMPLANT
GLOVE SURG SYN 7.5 PF PI (GLOVE) ×2 IMPLANT
GOWN STRL REUS W/ TWL LRG LVL3 (GOWN DISPOSABLE) ×4 IMPLANT
GOWN STRL REUS W/TWL LRG LVL3 (GOWN DISPOSABLE) ×12
IRRIGATION STRYKERFLOW (MISCELLANEOUS) ×1 IMPLANT
IRRIGATOR STRYKERFLOW (MISCELLANEOUS)
IV NS 1000ML (IV SOLUTION)
IV NS 1000ML BAXH (IV SOLUTION) IMPLANT
KIT PINK PAD W/HEAD ARE REST (MISCELLANEOUS) ×2
KIT PINK PAD W/HEAD ARM REST (MISCELLANEOUS) ×1 IMPLANT
LABEL OR SOLS (LABEL) ×2 IMPLANT
MANIFOLD NEPTUNE II (INSTRUMENTS) ×2 IMPLANT
MESH 3DMAX 4X6 RT LRG (Mesh General) ×1 IMPLANT
MESH 3DMAX MID 4X6 RT LRG (Mesh General) IMPLANT
NDL INSUFFLATION 14GA 120MM (NEEDLE) ×1 IMPLANT
NEEDLE HYPO 22GX1.5 SAFETY (NEEDLE) ×2 IMPLANT
NEEDLE INSUFFLATION 14GA 120MM (NEEDLE) ×2 IMPLANT
OBTURATOR OPTICAL STANDARD 8MM (TROCAR) ×2
OBTURATOR OPTICAL STND 8 DVNC (TROCAR) ×1
OBTURATOR OPTICALSTD 8 DVNC (TROCAR) ×1 IMPLANT
PACK LAP CHOLECYSTECTOMY (MISCELLANEOUS) ×2 IMPLANT
PENCIL ELECTRO HAND CTR (MISCELLANEOUS) ×2 IMPLANT
SEAL CANN UNIV 5-8 DVNC XI (MISCELLANEOUS) ×3 IMPLANT
SEAL XI 5MM-8MM UNIVERSAL (MISCELLANEOUS) ×6
SET TUBE SMOKE EVAC HIGH FLOW (TUBING) ×2 IMPLANT
SOLUTION ELECTROLUBE (MISCELLANEOUS) ×2 IMPLANT
SPONGE T-LAP 18X18 ~~LOC~~+RFID (SPONGE) ×1 IMPLANT
STAPLER CANNULA SEAL DVNC XI (STAPLE) ×1 IMPLANT
STAPLER CANNULA SEAL XI (STAPLE) ×2
SUT MNCRL AB 4-0 PS2 18 (SUTURE) ×2 IMPLANT
SUT VIC AB 2-0 SH 27 (SUTURE) ×4
SUT VIC AB 2-0 SH 27XBRD (SUTURE) ×2 IMPLANT
SUT VICRYL 0 AB UR-6 (SUTURE) ×4 IMPLANT
SUT VLOC 90 S/L VL9 GS22 (SUTURE) ×2 IMPLANT
TAPE TRANSPORE STRL 2 31045 (GAUZE/BANDAGES/DRESSINGS) ×2 IMPLANT
TRAY FOLEY SLVR 16FR LF STAT (SET/KITS/TRAYS/PACK) ×2 IMPLANT
TROCAR BALLN GELPORT 12X130M (ENDOMECHANICALS) ×2 IMPLANT

## 2021-01-02 NOTE — Op Note (Signed)
Procedure Date:  01/02/2021  Pre-operative Diagnosis:  Right inguinal hernia  Post-operative Diagnosis:  Right inguinal hernia  Procedure: 1.  Robotic assisted Right Inguinal Hernia Repair 2.  Creation of Right Posterior Rectus-Transversalis Fascia Advancment Flap for Coverage of Pelvic Wound (200 cm)  Surgeon:  [name], MD  Anesthesia:  General endotracheal  Estimated Blood Loss:  10 ml  Specimens:  None  Complications:  None  Indications for Procedure:  This is a 30 y.o. male who presents with a right inguinal hernia.  The options of surgery versus observation were reviewed with the patient and/or family. The risks of bleeding, abscess or infection, recurrence of symptoms, potential for an open procedure, injury to surrounding structures, and chronic pain were all discussed with the patient and he was willing to proceed.  We have planned this transabdominal procedure with the creation of right peritoneal flap based on the posterior rectus sheath and transversalis fascia in order to fully cover the mesh, creating a natural tisssue barrier for the bowel and peritoneal cavity.  Description of Procedure: The patient was correctly identified in the preoperative area and brought into the operating room.  The patient was placed supine with VTE prophylaxis in place.  Appropriate time-outs were performed.  Anesthesia was induced and the patient was intubated.  Foley catheter was placed.  Appropriate antibiotics were infused.  The abdomen was prepped and draped in a sterile fashion. A supraumbilical incision was made. A cutdown technique was used to enter the abdominal cavity without injury, and a Hasson trocar was inserted.  Pneumoperitoneum was obtained with appropriate opening pressures.  A Veress needle was used to start dissecting the peritoneal flap.  Two 8-mm robotic ports were placed in the right and left lateral positions under direct visualization.  A large 3D Max Bard Mid  Mesh, a 2-0 Vicryl, and 2-0 vloc suture were placed through the umbilical port under direct visualization.  The [employer] platform was docked onto the patient, the camera was inserted and targeted, and the instruments were placed under direct visualization.  Both inguinal regions were inspected for hernias and it was confirmed that the patient had a right inguinal hernia.  Using electocautery, the peritoneal and posterior rectus tissue flap was created.  The peritoneum on the right side was scored from the median umbilical ligament laterally towards the ASIS.  The flap was mobilized using robotic scissors and the bipolar instruments, creating a plane along the posterior rectus sheath and transversalis fascia down to the pubic tubercle medially. It was then further mobilized laterally across the inguinal canal and femoral vessels and onto the psoas muscle. The inferior epigastric vessels were identified and preserved. This created a posterior rectus and peritoneal flap measuring roughly 17 cm x 12 cm.  The hernia sac and contents were reduced preserving all structures.  A right large Bard 3D Max Mid mesh was placed with good overlap along all the potential hernia defects and secured in place with 2-0 Vicryl along the medial superomedial and superolateral aspects.  Then, the peritoneal flap was advanced over the mesh and carried over to close the defect. A running 2-0 V lock suture was used to approximate the edge of the flap onto the peritoneum.  All needles were removed under direct visualization.  The 8- mm ports were removed under direct visualization and the Hasson trocar was removed.  The fascial opening was closed using 0 vicryl suture.  Local anesthetic was infused in all incisions as well as a right  ilioinguinal block, and the incisions were closed with 4-0 Monocryl.  The wounds were cleaned and sealed with DermaBond.  Foley catheter was removed and the patient was emerged from anesthesia and extubated  and brought to the recovery room for further management.  The patient tolerated the procedure well and all counts were correct at the end of the case.   [name], MD

## 2021-01-02 NOTE — OR Nursing (Signed)
2 POSTOP APPTS LISTED, PT ADVISES HE WILL CALL OFFICE TO CONFIRM WHICH ONE.

## 2021-01-02 NOTE — Anesthesia Preprocedure Evaluation (Addendum)
Anesthesia Evaluation  Patient identified by MRN, date of birth, ID band Patient awake    Reviewed: Allergy & Precautions, H&P , NPO status , Patient's Chart, lab work & pertinent test results  Airway Mallampati: I  TM Distance: >3 FB Neck ROM: Full    Dental no notable dental hx.    Pulmonary Current Smoker and Patient abstained from smoking.,    + rhonchi        Cardiovascular  Rhythm:Regular Rate:Normal     Neuro/Psych  Headaches, PSYCHIATRIC DISORDERS ADHD   GI/Hepatic (+)     substance abuse (2 month sober)  marijuana use and IV drug use, Inguinal hernia   Endo/Other  negative endocrine ROS  Renal/GU      Musculoskeletal negative musculoskeletal ROS (+)   Abdominal Normal abdominal exam  (+)   Peds  (+) ADHD Hematology negative hematology ROS (+)   Anesthesia Other Findings   Reproductive/Obstetrics                            Anesthesia Physical  Anesthesia Plan  ASA: II  Anesthesia Plan: General   Post-op Pain Management:    Induction: Intravenous  PONV Risk Score and Plan: 1 and Ondansetron and Dexamethasone  Airway Management Planned: Oral ETT  Additional Equipment: None  Intra-op Plan:   Post-operative Plan: Extubation in OR  Informed Consent: I have reviewed the patients History and Physical, chart, labs and discussed the procedure including the risks, benefits and alternatives for the proposed anesthesia with the patient or authorized representative who has indicated his/her understanding and acceptance.     Dental advisory given  Plan Discussed with: CRNA, Surgeon and Anesthesiologist  Anesthesia Plan Comments:        Anesthesia Quick Evaluation

## 2021-01-02 NOTE — Discharge Instructions (Addendum)
     AMBULATORY SURGERY  DISCHARGE INSTRUCTIONS   The drugs that you were given will stay in your system until tomorrow so for the next 24 hours you should not:  Drive an automobile Make any legal decisions Drink any alcoholic beverage   You may resume regular meals tomorrow.  Today it is better to start with liquids and gradually work up to solid foods.  You may eat anything you prefer, but it is better to start with liquids, then soup and crackers, and gradually work up to solid foods.   Please notify your doctor immediately if you have any unusual bleeding, trouble breathing, redness and pain at the surgery site, drainage, fever, or pain not relieved by medication.    Additional Instructions:   LEAVE GREEN BRACELET (EXPAREL MEDICATION) IN PLACE FOR 3 DAYS)        Please contact your physician with any problems or Same Day Surgery at [phone], Monday through Friday 6 am to 4 pm, or [place] at [facility] [place] number at [phone].

## 2021-01-02 NOTE — OR Nursing (Addendum)
Pt in postop, frustrated he can't void; bladder scan 170 ml, has had 360 cc po and 100 ml IVF in postop; can I discharge him and instruct to seek medical attention if unable to void at home?  UPDATE - He voided :)))

## 2021-01-02 NOTE — Interval H&P Note (Signed)
History and Physical Interval Note:  01/02/2021 9:05 AM  Bob Vargas  has presented today for surgery, with the diagnosis of right inguinal hernia.  The various methods of treatment have been discussed with the patient and family. After consideration of risks, benefits and other options for treatment, the patient has consented to  Procedure(s): XI ROBOTIC ASSISTED INGUINAL HERNIA REPAIR WITH MESH (Right) as a surgical intervention.  The patient's history has been reviewed, patient examined, no change in status, stable for surgery.  I have reviewed the patient's chart and labs.  Questions were answered to the patient's satisfaction.     Kannen Moxey

## 2021-01-02 NOTE — Anesthesia Postprocedure Evaluation (Signed)
Anesthesia Post Note  Patient: Bob Vargas  Procedure(s) Performed: XI ROBOTIC ASSISTED INGUINAL HERNIA REPAIR WITH MESH (Right)  Patient location during evaluation: PACU Anesthesia Type: General Level of consciousness: awake and alert Pain management: pain level controlled Vital Signs Assessment: post-procedure vital signs reviewed and stable Respiratory status: spontaneous breathing, nonlabored ventilation and respiratory function stable Cardiovascular status: blood pressure returned to baseline and stable Postop Assessment: no apparent nausea or vomiting Anesthetic complications: no   No notable events documented.   Last Vitals:  Vitals:   01/02/21 1330 01/02/21 1350  BP: (!) 142/81 (!) 123/98  Pulse: 86 79  Resp: 16 16  Temp: 36.6 C 36.5 C  SpO2: 96% 96%    Last Pain:  Vitals:   01/02/21 1350  TempSrc: Temporal  PainSc: 4                  Foye Deer

## 2021-01-02 NOTE — Transfer of Care (Signed)
Immediate Anesthesia Transfer of Care Note  Patient: Bob Vargas  Procedure(s) Performed: XI ROBOTIC ASSISTED INGUINAL HERNIA REPAIR WITH MESH (Right)  Patient Location: PACU  Anesthesia Type:General  Level of Consciousness: drowsy  Airway & Oxygen Therapy: Patient Spontanous Breathing and Patient connected to face mask oxygen  Post-op Assessment: Report given to RN and Post -op Vital signs reviewed and stable  Post vital signs: Reviewed and stable  Last Vitals:  Vitals Value Taken Time  BP 105/59 01/02/21 1151  Temp    Pulse 65 01/02/21 1156  Resp 13 01/02/21 1156  SpO2 97 % 01/02/21 1156  Vitals shown include unvalidated device data.  Last Pain:  Vitals:   01/02/21 0806  TempSrc: Oral  PainSc: 0-No pain         Complications: No notable events documented.

## 2021-01-02 NOTE — Anesthesia Procedure Notes (Signed)
Procedure Name: Intubation Date/Time: 01/02/2021 9:40 AM Performed by: Esaw Grandchild, CRNA Pre-anesthesia Checklist: Patient identified, Emergency Drugs available, Suction available and Patient being monitored Patient Re-evaluated:Patient Re-evaluated prior to induction Oxygen Delivery Method: Circle system utilized Preoxygenation: Pre-oxygenation with 100% oxygen Induction Type: IV induction Ventilation: Mask ventilation without difficulty Laryngoscope Size: Miller and 1 Grade View: Grade I Tube type: Oral Tube size: 7.5 mm Number of attempts: 1 Airway Equipment and Method: Stylet, Oral airway, Bite block and LTA kit utilized Placement Confirmation: ETT inserted through vocal cords under direct vision, positive ETCO2 and breath sounds checked- equal and bilateral Secured at: 23 cm Tube secured with: Tape Dental Injury: Teeth and Oropharynx as per pre-operative assessment

## 2021-01-03 ENCOUNTER — Encounter: Payer: Self-pay | Admitting: Surgery

## 2021-01-06 ENCOUNTER — Institutional Professional Consult (permissible substitution): Payer: BC Managed Care – PPO | Admitting: Family Medicine

## 2021-01-15 ENCOUNTER — Other Ambulatory Visit: Payer: Self-pay

## 2021-01-15 ENCOUNTER — Telehealth (INDEPENDENT_AMBULATORY_CARE_PROVIDER_SITE_OTHER): Payer: Managed Care, Other (non HMO) | Admitting: Family Medicine

## 2021-01-15 ENCOUNTER — Encounter: Payer: Self-pay | Admitting: Family Medicine

## 2021-01-15 VITALS — Temp 98.7°F | Wt 200.0 lb

## 2021-01-15 DIAGNOSIS — Z9889 Other specified postprocedural states: Secondary | ICD-10-CM | POA: Diagnosis not present

## 2021-01-15 DIAGNOSIS — F1910 Other psychoactive substance abuse, uncomplicated: Secondary | ICD-10-CM | POA: Diagnosis not present

## 2021-01-15 DIAGNOSIS — Z8719 Personal history of other diseases of the digestive system: Secondary | ICD-10-CM | POA: Diagnosis not present

## 2021-01-15 MED ORDER — ESCITALOPRAM OXALATE 20 MG PO TABS: 20.0000 mg | ORAL_TABLET | Freq: Every day | ORAL | 1 refills | Status: DC

## 2021-01-15 NOTE — Progress Notes (Signed)
   Subjective:    Patient ID: Bob Vargas, male    DOB: [birth year], 30 y.o.   MRN: [id number]  HPI Documentation for virtual audio and video telecommunications through Caregility encounter: The patient was located at home. 2 patient identifiers used.  The provider was located in the office. The patient did consent to this visit and is aware of possible charges through their insurance for this visit. The other persons participating in this telemedicine service were none. Time spent on call was 5 minutes and in review of previous records >33 minutes total for counseling and coordination of care. This virtual service is not related to other E/M service within previous 7 days.  Today's visit is to discuss follow-up on a recent hospitalization at [facility] recovery center.  He initially went through a detox at a separate place and was at [facility] recovery for 32 days.  He has a previous history of DUI and lost his license because of that.  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  He also recently had right hernia surgery approximately 2 weeks ago and is recovering from that.  His medications were changed and he is presently on Seroquel, Lexapro, Mirapex as well as a muscle relaxer and gabapentin.  He thinks it is Lexapro needs to be increased.  He apparently did make a call to a psychiatrist but states that it was too expensive and he has issues with work and travel interfering.  He plans to go to AA meetings to help. They stopped his Adderall. Review of Systems     Objective:   Physical Exam Alert and in no distress.  His voice is somewhat halting.       Assessment & Plan:   Drug abuse (HCC) After discussion with him, I recommended that he stop taking the gabapentin and the muscle relaxer.  He will continue on Seroquel and Mirapex and I will increase his Lexapro to 20 mg. He is to come by to sign a release form so we can get the information from the recovery  center. He is to call and set up an appointment with an addiction specialist.  I explained that I will cover him until he can get into the specialist and they would be handling his medications from that point on.  I will renew his medications until he can get an appointment set up. Recommend that he switch to Tylenol 2 tablets 4 times per day as needed for his surgical pain and also up to 4 ibuprofen 3 times a day to help with that.

## 2021-01-17 ENCOUNTER — Ambulatory Visit (INDEPENDENT_AMBULATORY_CARE_PROVIDER_SITE_OTHER): Payer: Managed Care, Other (non HMO) | Admitting: Physician Assistant

## 2021-01-17 ENCOUNTER — Encounter: Payer: Self-pay | Admitting: Physician Assistant

## 2021-01-17 ENCOUNTER — Other Ambulatory Visit: Payer: Self-pay

## 2021-01-17 VITALS — BP 136/83 | HR 86 | Temp 98.9°F | Ht 69.0 in | Wt 205.0 lb

## 2021-01-17 DIAGNOSIS — K4090 Unilateral inguinal hernia, without obstruction or gangrene, not specified as recurrent: Secondary | ICD-10-CM

## 2021-01-17 DIAGNOSIS — Z09 Encounter for follow-up examination after completed treatment for conditions other than malignant neoplasm: Secondary | ICD-10-CM

## 2021-01-17 MED ORDER — CYCLOBENZAPRINE HCL 7.5 MG PO TABS
7.5000 mg | ORAL_TABLET | Freq: Three times a day (TID) | ORAL | 0 refills | Status: DC | PRN
Start: 2021-01-17 — End: 2023-11-17

## 2021-01-17 NOTE — Progress Notes (Signed)
[facility] POST-OP OFFICE VISIT  01/17/2021  HPI: Bob Vargas is a 30 y.o. male 15 days s/p robotic assisted laparoscopic right inguinal hernia with Dr Aleen Campi.  He has been doing well He reports some soreness at his port sites, mainly on the right, after a day of working. This is mostly a soreness. He is using 1-2 tylenol and ibuprofen a day for this and flexeril QHS. He does not feel the gabapentin is adding anything.  No fever, chills, nausea, emesis, or bowel changes No issues with incisions Otherwise doing well without any other complaints.   Vital signs: BP 136/83   Pulse 86   Temp 98.9 F (37.2 C)   Ht 5\' 9"  (1.753 m)   Wt 205 lb (93 kg)   SpO2 97%   BMI 30.27 kg/m    Physical Exam: Constitutional: Well appearing male, NAD Abdomen: Soft, non-tender, non-distended, no rebound/guarding GU: Chaperone present, no evidence of inguinal hernia recurrence, no scrotal swelling Skin: Laparoscopic incisions are well healed, no erythema or drainage   Assessment/Plan: This is a 30 y.o. male 15 days s/p robotic assisted laparoscopic right inguinal hernia   - Pain control prn; tylenol/Motrin, will refill Flexeril QHS, stop gabapentin  - Reviewed wound care  - Reviewed lifting restrictions; 6 weeks total   - He will follow up on as needed basis   -- 26, PA-C [facility] 01/17/2021, 10:23 AM [phone] M-F: 7am - 4pm

## 2021-01-17 NOTE — Patient Instructions (Addendum)
We have refilled your Cyclobenzaprine. Run your Gabapentin out and see how you do with out it.  Continue to take Ibuprofen and Tylenol as needed for discomfort.   Be careful with lifting anything heavy for 4 more weeks. You may use heat to the area for 10-15 minutes twice a day to aid healing and for comfort.    Follow-up with our office as needed.  Please call and ask to speak with a nurse if you develop questions or concerns.

## 2021-01-29 ENCOUNTER — Other Ambulatory Visit: Payer: Self-pay | Admitting: Surgery

## 2021-03-04 ENCOUNTER — Encounter: Payer: Self-pay | Admitting: General Surgery

## 2021-05-16 ENCOUNTER — Encounter: Payer: BC Managed Care – PPO | Admitting: Family Medicine

## 2021-05-16 DIAGNOSIS — Z0000 Encounter for general adult medical examination without abnormal findings: Secondary | ICD-10-CM

## 2021-05-27 ENCOUNTER — Encounter: Payer: Self-pay | Admitting: Family Medicine

## 2022-02-18 ENCOUNTER — Encounter: Payer: Self-pay | Admitting: Internal Medicine

## 2022-03-24 ENCOUNTER — Encounter: Payer: Self-pay | Admitting: Internal Medicine

## 2022-04-06 ENCOUNTER — Encounter: Payer: Self-pay | Admitting: Internal Medicine

## 2022-06-21 ENCOUNTER — Other Ambulatory Visit: Payer: Self-pay | Admitting: Family Medicine

## 2022-06-21 MED ORDER — ONDANSETRON HCL 8 MG PO TABS: 8.0000 mg | ORAL_TABLET | Freq: Three times a day (TID) | ORAL | 0 refills | Status: DC | PRN

## 2022-06-21 NOTE — Progress Notes (Signed)
He is having trouble with nausea and vomiting.I will call in Zofran

## 2022-06-22 ENCOUNTER — Encounter (HOSPITAL_COMMUNITY): Payer: Self-pay

## 2022-06-22 ENCOUNTER — Ambulatory Visit (INDEPENDENT_AMBULATORY_CARE_PROVIDER_SITE_OTHER): Payer: Managed Care, Other (non HMO)

## 2022-06-22 ENCOUNTER — Ambulatory Visit (HOSPITAL_COMMUNITY)
Admission: EM | Admit: 2022-06-22 | Discharge: 2022-06-22 | Disposition: A | Discharge status: Home / Self Care | Payer: Managed Care, Other (non HMO) | Attending: Emergency Medicine | Admitting: Emergency Medicine

## 2022-06-22 DIAGNOSIS — J189 Pneumonia, unspecified organism: Secondary | ICD-10-CM

## 2022-06-22 MED ORDER — AMOXICILLIN-POT CLAVULANATE 875-125 MG PO TABS: 1.0000 | ORAL_TABLET | Freq: Two times a day (BID) | ORAL | 0 refills | Status: AC

## 2022-06-22 MED ORDER — AZITHROMYCIN 250 MG PO TABS: 250.0000 mg | ORAL_TABLET | Freq: Every day | ORAL | 0 refills | Status: DC

## 2022-06-22 MED ORDER — AZITHROMYCIN 500 MG PO TABS: 500.0000 mg | ORAL_TABLET | Freq: Once | ORAL | 0 refills | Status: AC

## 2022-06-22 NOTE — Discharge Instructions (Addendum)
Please take medication as prescribed. Take with food to avoid upset stomach.  Drink lots of fluids. Continue tylenol/ibuprofen as needed for aches.  Please return if symptoms do not improve after the 5 days of medicine.

## 2022-06-22 NOTE — ED Triage Notes (Signed)
Pt c/o cough, fever, and chest congestion x6 days. States had n/v x24 hrs that has subsided. States having SOB and chest tightness since yesterday. States took ibuprofen and tylenol an hour ago. States his son had the flu last week.

## 2022-06-22 NOTE — ED Provider Notes (Signed)
[place]    CSN: [id number] Arrival date & time: 06/22/22  0802      History   Chief Complaint Chief Complaint  Patient presents with   Cough   Shortness of Breath    HPI Bob Vargas is a 32 y.o. male.  Poor historian, answers "I don't know" to most questions Possibly 6 day history of cough Had fever at first, temp not taken but felt hot Reports chest congestion. Feels shortness of breath and chest tightness that began yesterday  Has taken ibu/tylenol. Last dose 1 hour ago Has not used any cough medicines  Son had the flu last week  No history of lung issues Current smoker  Past Medical History:  Diagnosis Date   ADHD (attention deficit hyperactivity disorder)    Allergy    Headache(784.0)    Hepatitis C    per pt cleared on its own   MRSA infection (methicillin-resistant Staphylococcus aureus)    has had 3 times in past   Polysubstance abuse ([place])    BZO, ETOH, marijuana, opioids    Patient Active Problem List   Diagnosis Date Noted   History of hernia surgery 01/15/2021   Drug abuse ([place]) 01/15/2021   Right inguinal hernia    History of hepatitis B 06/10/2020   Current smoker 07/30/2015   Hydrocele in adult 07/30/2015    Past Surgical History:  Procedure Laterality Date   FOREIGN BODY REMOVAL Right 10/06/2013   Procedure: FOREIGN BODY REMOVAL ADULT right index finger;  Surgeon: Schuyler Amor, MD;  Location: [place];  Service: Orthopedics;  Laterality: Right;   KNEE ARTHROSCOPY Right    KNEE SURGERY Right 07/06/2015   MYRINGOTOMY     XI ROBOTIC ASSISTED INGUINAL HERNIA REPAIR WITH MESH Right 01/02/2021   Procedure: XI ROBOTIC ASSISTED INGUINAL HERNIA REPAIR WITH MESH;  Surgeon: Olean Ree, MD;  Location: [facility];  Service: General;  Laterality: Right;    Home Medications    Prior to Admission medications   Medication Sig Start Date End Date Taking? Authorizing Provider  amoxicillin-clavulanate  (AUGMENTIN) 875-125 MG tablet Take 1 tablet by mouth 2 (two) times daily for 5 days. 06/22/22 06/27/22 Yes Rhilynn Preyer, Wells Guiles, PA-C  azithromycin (ZITHROMAX) 250 MG tablet Take 1 tablet (250 mg total) by mouth daily for 4 days. Starting tomorrow (1/9) 06/23/22 06/27/22 Yes Ronnita Paz, Wells Guiles, PA-C  azithromycin (ZITHROMAX) 500 MG tablet Take 1 tablet (500 mg total) by mouth once for 1 dose. 06/22/22 06/22/22 Yes Lynnwood Beckford, Wells Guiles, PA-C  acetaminophen (TYLENOL) 500 MG tablet Take 2 tablets (1,000 mg total) by mouth every 6 (six) hours as needed for mild pain. 01/02/21   Olean Ree, MD  cyclobenzaprine (FEXMID) 7.5 MG tablet Take 1 tablet (7.5 mg total) by mouth 3 (three) times daily as needed for muscle spasms. 01/17/21   Tylene Fantasia, PA-C  escitalopram (LEXAPRO) 20 MG tablet Take 1 tablet (20 mg total) by mouth daily. 01/15/21   Denita Lung, MD  gabapentin (NEURONTIN) 300 MG capsule Take 1 capsule (300 mg total) by mouth 3 (three) times daily for 90 doses. 01/02/21 02/01/21  Olean Ree, MD  hydrOXYzine (VISTARIL) 50 MG capsule Take 100 mg by mouth every 8 (eight) hours as needed. 12/18/20   [provider]  ibuprofen (ADVIL) 800 MG tablet Take 1 tablet (800 mg total) by mouth every 8 (eight) hours as needed for moderate pain. 01/02/21   Olean Ree, MD  methocarbamol (ROBAXIN) 500 MG tablet Take 500  mg by mouth 3 (three) times daily as needed for muscle spasms.    [provider]  ondansetron (ZOFRAN) 8 MG tablet Take 1 tablet (8 mg total) by mouth every 8 (eight) hours as needed for nausea or vomiting. 06/21/22   Denita Lung, MD  pramipexole (MIRAPEX) 0.125 MG tablet Take 0.125 mg by mouth at bedtime.    [provider]  QUEtiapine (SEROQUEL) 200 MG tablet Take 200 mg by mouth.    [provider]    Family History Family History  Problem Relation Age of Onset   Healthy Mother    Hyperlipidemia Father    Alcoholism Father     Social History Social History    Tobacco Use   Smoking status: Every Day    Packs/day: 0.25    Types: Cigarettes   Smokeless tobacco: Never  Vaping Use   Vaping Use: Never used  Substance Use Topics   Alcohol use: Not Currently    Alcohol/week: 14.0 standard drinks of alcohol    Types: 14 Cans of beer per week   Drug use: Not Currently    Types: IV, Marijuana    Comment: Heroin with Fentanyl     Allergies   Patient has no known allergies.   Review of Systems Review of Systems As per HPI  Physical Exam Triage Vital Signs ED Triage Vitals [06/22/22 0823]  Enc Vitals Group     BP 134/79     Pulse Rate (!) 126     Resp 18     Temp 98.8 F (37.1 C)     Temp Source Oral     SpO2 96 %     Weight      Height      Head Circumference      Peak Flow      Pain Score 9     Pain Loc      Pain Edu?      Excl. in [place]?    No data found.  Updated Vital Signs BP 134/79 (BP Location: Left Arm)   Pulse (!) 126   Temp 98.8 F (37.1 C) (Oral)   Resp 18   SpO2 96%    Physical Exam Vitals and nursing note reviewed.  Constitutional:      General: He is not in acute distress.    Appearance: Normal appearance.     Comments: Appears fatigued  HENT:     Mouth/Throat:     Mouth: Mucous membranes are moist.     Pharynx: Oropharynx is clear. No posterior oropharyngeal erythema.  Cardiovascular:     Rate and Rhythm: Normal rate and regular rhythm.     Pulses: Normal pulses.     Heart sounds: Normal heart sounds.     Comments: Regular rate with auscultation  Pulmonary:     Effort: Pulmonary effort is normal. No respiratory distress.     Comments: Some decreased sounds on the right, mid/upper. No respiratory distress. Normal WOB and rate Skin:    General: Skin is warm and dry.  Neurological:     Mental Status: He is alert and oriented to person, place, and time.    UC Treatments / Results  Labs (all labs ordered are listed, but only abnormal results are displayed) Labs Reviewed - No data to  display  EKG  Radiology DG Chest 2 View  Result Date: 06/22/2022 CLINICAL DATA:  Six day history of cough, fever, and congestion EXAM: CHEST - 2 VIEW COMPARISON:  None Available.  FINDINGS: Normal lung volumes. Right upper lobe consolidation with air bronchograms and hazy opacity involving the right middle lobe and basilar left lower lobe. No pleural effusion or pneumothorax. The heart size and mediastinal contours are within normal limits. The visualized skeletal structures are unremarkable. IMPRESSION: Multifocal pneumonia, most notably involving right upper lobar consolidation. Electronically Signed   By: Agustin Cree M.D.   On: 06/22/2022 09:02    Procedures Procedures   Medications Ordered in UC Medications - No data to display  Initial Impression / Assessment and Plan / UC Course  I have reviewed the triage vital signs and the nursing notes.  Pertinent labs & imaging results that were available during my care of the patient were reviewed by me and considered in my medical decision making (see chart for details).  Tachycardic on arrival, RRR with auscultation Afebrile here  Chest xray showing multifocal pneumonia, consolidation right upper lobe. Consistent with exam.  With patient history of smoking, double coverage with Augmentin BID x 5 days, azithromycin 500 mg x 1 day followed by 250 mg daily x 4 days.  Return precautions discussed. ED precautions for worsening symptoms or no improvement with outpatient abx. Patient agrees to plan  Final Clinical Impressions(s) / UC Diagnoses   Final diagnoses:  Multifocal pneumonia     Discharge Instructions      Please take medication as prescribed. Take with food to avoid upset stomach.  Drink lots of fluids. Continue tylenol/ibuprofen as needed for aches.  Please return if symptoms do not improve after the 5 days of medicine.     ED Prescriptions     Medication Sig Dispense Auth. Provider   amoxicillin-clavulanate  (AUGMENTIN) 875-125 MG tablet Take 1 tablet by mouth 2 (two) times daily for 5 days. 10 tablet Jaquawn Saffran, PA-C   azithromycin (ZITHROMAX) 500 MG tablet Take 1 tablet (500 mg total) by mouth once for 1 dose. 1 tablet Kaysi Ourada, PA-C   azithromycin (ZITHROMAX) 250 MG tablet Take 1 tablet (250 mg total) by mouth daily for 4 days. Starting tomorrow (1/9) 4 tablet Gabriel Conry, Lurena Joiner, PA-C      PDMP not reviewed this encounter.   Marlow Baars, [place] 06/22/22 [phone]

## 2022-06-25 ENCOUNTER — Ambulatory Visit: Payer: Managed Care, Other (non HMO) | Admitting: Family Medicine

## 2022-06-25 ENCOUNTER — Telehealth: Payer: Managed Care, Other (non HMO) | Admitting: Family Medicine

## 2022-06-25 ENCOUNTER — Encounter: Payer: Self-pay | Admitting: Family Medicine

## 2022-06-25 ENCOUNTER — Ambulatory Visit
Admission: RE | Admit: 2022-06-25 | Discharge: 2022-06-25 | Disposition: A | Discharge status: Home / Self Care | Payer: Managed Care, Other (non HMO) | Source: Ambulatory Visit | Attending: Family Medicine | Admitting: Family Medicine

## 2022-06-25 VITALS — BP 140/84 | HR 83 | Temp 98.0°F | Resp 20 | Wt 179.6 lb

## 2022-06-25 DIAGNOSIS — R1111 Vomiting without nausea: Secondary | ICD-10-CM

## 2022-06-25 DIAGNOSIS — J189 Pneumonia, unspecified organism: Secondary | ICD-10-CM | POA: Diagnosis not present

## 2022-06-25 MED ORDER — AZITHROMYCIN 500 MG PO TABS: 500.0000 mg | ORAL_TABLET | Freq: Every day | ORAL | 0 refills | Status: DC

## 2022-06-25 MED ORDER — ONDANSETRON HCL 8 MG PO TABS: 8.0000 mg | ORAL_TABLET | Freq: Three times a day (TID) | ORAL | 0 refills | Status: DC | PRN

## 2022-06-25 NOTE — Progress Notes (Signed)
   Subjective:    Patient ID: Bob Vargas, male    DOB: 1991/04/30, 32 y.o.   MRN: [id number]  HPI He is here for consult concerning recent diagnosis of pneumonia.  He originally got sick around January 1 with flulike symptoms.  His son was diagnosed with the flu.  He was having difficulty with fever, chills, malaise and coughing.  He then had difficulty with vomiting and diarrhea.  He did call and I called in some Zofran for him however he went to the emergency room on January 8.  An x-ray did show right upper lobe pneumonia but also some question of other areas of the lungs being involved.  He states that today he is really doing no better but also indicates that he has not necessarily been able to keep the antibiotics down due to the vomiting and apparently missed at least 1 if not more doses.   Review of Systems     Objective:   Physical Exam Alert and toxic-appearing.  Throat is clear.  Neck is supple without adenopathy.  Lungs actually sound pretty good.  Cardiac exam shows regular rhythm without murmurs or gallops       Assessment & Plan:  Pneumonia of right upper lobe due to infectious organism - Plan: DG Chest 2 View, azithromycin (ZITHROMAX) 500 MG tablet  Vomiting without nausea, unspecified vomiting type - Plan: ondansetron (ZOFRAN) 8 MG tablet  I am giving him more antibiotic because of not sure if he got correct dosing on it.  He is to take Zofran prior to taking the antibiotic to make sure that it stays down.  An x-ray was ordered but there was a problem with the equipment and he went to get the x-ray after he left the office.  I discussed blood work but he did not want any blood work done.  Radiology is to call him with the results.  I am concerned about the right upper lobe pneumonia and also there was evidence that might be some other issues in other parts of his lung.

## 2022-10-06 ENCOUNTER — Encounter: Payer: Managed Care, Other (non HMO) | Admitting: Family Medicine

## 2022-10-14 ENCOUNTER — Ambulatory Visit: Payer: Managed Care, Other (non HMO) | Admitting: Family Medicine

## 2022-10-14 ENCOUNTER — Encounter: Payer: Self-pay | Admitting: Family Medicine

## 2022-10-14 VITALS — BP 132/80 | HR 64 | Temp 97.9°F | Resp 16 | Wt 188.2 lb

## 2022-10-14 DIAGNOSIS — Z8614 Personal history of Methicillin resistant Staphylococcus aureus infection: Secondary | ICD-10-CM | POA: Diagnosis not present

## 2022-10-14 DIAGNOSIS — M79642 Pain in left hand: Secondary | ICD-10-CM

## 2022-10-14 DIAGNOSIS — F909 Attention-deficit hyperactivity disorder, unspecified type: Secondary | ICD-10-CM

## 2022-10-14 DIAGNOSIS — M79641 Pain in right hand: Secondary | ICD-10-CM

## 2022-10-14 MED ORDER — AMPHETAMINE-DEXTROAMPHETAMINE 20 MG PO TABS: 20.0000 mg | ORAL_TABLET | Freq: Two times a day (BID) | ORAL | 0 refills | Status: DC

## 2022-10-14 NOTE — Patient Instructions (Signed)
so use Dial soap regularly.  You can also use Hibiclens several times per week.  If you get an infection let me know and I will call in antibiotic

## 2022-10-14 NOTE — Progress Notes (Signed)
   Subjective:    Patient ID: Bob Vargas, male    DOB: 1990/12/06, 32 y.o.   MRN: [id number]  HPI He is here for consult concerning multiple issues.  He does have underlying ADD and in the past had done quite well on Adderall.  He would like to get started back on this. He also thinks that he has carpal tunnel syndrome but then describes pain in all of his fingers that bothers him mainly at night.  He also points to the elbow but did not give a good picture for ulnar neuropathy or necessarily CTS. He then stated that he has had difficulty intermittently with MRSA.  Apparently in the past he had had a culture which did show this.  He does get intermittent boils but has not taken any medicines or been involved in any therapies for this.   Review of Systems     Objective:   Physical Exam Alert and in no distress.  Exam of the skin does show several healing lesions.  Ulnar and median nerve testing was not done at the present time.testing was not done at the present time.       Assessment & Plan:  Attention deficit hyperactivity disorder (ADHD), unspecified ADHD type  History of MRSA infection  Pain in both hands Explained that it might be difficult to get the medication and he is keep me informed as to his success. I then discussed the treatment of MRSA in terms of using Dial soap as well as Hibiclens several times per week and if he gets another infection to let me know for possible antibiotic.  I also discussed refilling the use of a cup of Clorox in a tub of water on a weekly basis.  I did not recommend it at the present time. I then discussed ulnar neuropathy as well as CTS.  Discussed keeping his wrist in the neutral position and using the night splints that he apparently has at home.  Explained that that should help with the thumb index and large finger symptoms.  Explained that he also might need to keep his elbow straight to see what effect that has on his pain but  specifically the fourth and fifth fingers.  He is to keep me informed concerning this.  Discussed options of being more aggressive especially with his work load.

## 2022-11-18 ENCOUNTER — Ambulatory Visit (INDEPENDENT_AMBULATORY_CARE_PROVIDER_SITE_OTHER): Payer: Managed Care, Other (non HMO) | Admitting: Family Medicine

## 2022-11-18 ENCOUNTER — Encounter: Payer: Self-pay | Admitting: Family Medicine

## 2022-11-18 DIAGNOSIS — F909 Attention-deficit hyperactivity disorder, unspecified type: Secondary | ICD-10-CM

## 2022-11-18 MED ORDER — AMPHETAMINE-DEXTROAMPHETAMINE 20 MG PO TABS: 20.0000 mg | ORAL_TABLET | Freq: Two times a day (BID) | ORAL | 0 refills | Status: DC

## 2022-11-18 NOTE — Progress Notes (Signed)
   Subjective:    Patient ID: Bob Vargas, male    DOB: [birth year], 32 y.o.   MRN: [id number]  HPI He is here for an interval evaluation.  He does have underlying ADHD and has been on medications for a long period of time.  He would like a refill on his Adderall.  He states that it lasts roughly 5 to 6 hours and keeps him well focused and when he comes off he has no residual difficulty.  Otherwise he has no particular concerns or complaints.  He does have a 41-1/2-year-old son which is changed his life around completely.  In the past he had difficulty with drugs and apparently was in rehab for a while but now is doing quite well.  He works as a Psychologist, occupational.   Review of Systems     Objective:   Physical Exam Alert and in no distress.  Cardiac exam shows regular rhythm without murmurs or gallops.  Lungs are clear to auscultation.       Assessment & Plan:  Attention deficit hyperactivity disorder (ADHD), unspecified ADHD type - Plan: amphetamine-dextroamphetamine (ADDERALL) 20 MG tablet I congratulated him on turning his life around.  He will continue on his present medication regimen.

## 2022-12-21 ENCOUNTER — Telehealth: Payer: Self-pay | Admitting: Family Medicine

## 2022-12-21 DIAGNOSIS — F909 Attention-deficit hyperactivity disorder, unspecified type: Secondary | ICD-10-CM

## 2022-12-21 MED ORDER — AMPHETAMINE-DEXTROAMPHETAMINE 20 MG PO TABS: 20.0000 mg | ORAL_TABLET | Freq: Two times a day (BID) | ORAL | 0 refills | Status: DC

## 2022-12-21 NOTE — Telephone Encounter (Signed)
Pt requesting a refill on adderall to  [id number] - [place], [place] - [address] AT [address] [facility] [address]

## 2023-01-20 ENCOUNTER — Telehealth: Payer: Self-pay | Admitting: Family Medicine

## 2023-01-20 DIAGNOSIS — F909 Attention-deficit hyperactivity disorder, unspecified type: Secondary | ICD-10-CM

## 2023-01-20 MED ORDER — AMPHETAMINE-DEXTROAMPHETAMINE 20 MG PO TABS: 20.0000 mg | ORAL_TABLET | Freq: Two times a day (BID) | ORAL | 0 refills | Status: DC

## 2023-01-20 NOTE — Telephone Encounter (Signed)
Pt requesting a refill on adderall sent to  [id number] - [place], [place] - [address] AT [address] [facility] [address]

## 2023-02-19 ENCOUNTER — Telehealth: Payer: Self-pay

## 2023-02-19 DIAGNOSIS — F909 Attention-deficit hyperactivity disorder, unspecified type: Secondary | ICD-10-CM

## 2023-02-19 MED ORDER — AMPHETAMINE-DEXTROAMPHETAMINE 20 MG PO TABS
20.0000 mg | ORAL_TABLET | Freq: Two times a day (BID) | ORAL | 0 refills | Status: DC
Start: 2023-02-21 — End: 2023-03-25

## 2023-02-19 NOTE — Telephone Encounter (Signed)
Pt requesting refill on adderall. Last appt. 12/08/22

## 2023-03-25 ENCOUNTER — Telehealth: Payer: Self-pay | Admitting: Family Medicine

## 2023-03-25 DIAGNOSIS — F909 Attention-deficit hyperactivity disorder, unspecified type: Secondary | ICD-10-CM

## 2023-03-25 MED ORDER — AMPHETAMINE-DEXTROAMPHETAMINE 20 MG PO TABS
20.0000 mg | ORAL_TABLET | Freq: Two times a day (BID) | ORAL | 0 refills | Status: DC
Start: 2023-03-25 — End: 2023-04-27

## 2023-03-25 NOTE — Telephone Encounter (Signed)
[facility] 

## 2023-04-27 ENCOUNTER — Other Ambulatory Visit: Payer: Self-pay | Admitting: Medical

## 2023-04-27 ENCOUNTER — Telehealth: Payer: Self-pay | Admitting: Family Medicine

## 2023-04-27 DIAGNOSIS — F909 Attention-deficit hyperactivity disorder, unspecified type: Secondary | ICD-10-CM

## 2023-04-27 MED ORDER — AMPHETAMINE-DEXTROAMPHETAMINE 20 MG PO TABS
20.0000 mg | ORAL_TABLET | Freq: Two times a day (BID) | ORAL | 0 refills | Status: DC
Start: 2023-04-27 — End: 2023-05-25

## 2023-04-27 NOTE — Telephone Encounter (Signed)
Pt needs adderall refill  [facility]

## 2023-05-25 ENCOUNTER — Telehealth: Payer: Self-pay

## 2023-05-25 DIAGNOSIS — F909 Attention-deficit hyperactivity disorder, unspecified type: Secondary | ICD-10-CM

## 2023-05-25 MED ORDER — AMPHETAMINE-DEXTROAMPHETAMINE 20 MG PO TABS
20.0000 mg | ORAL_TABLET | Freq: Two times a day (BID) | ORAL | 0 refills | Status: DC
Start: 2023-05-27 — End: 2023-06-23

## 2023-05-25 NOTE — Telephone Encounter (Signed)
Pt needs a refill on adderall. Last appt. 11/18/22. Next appt. 06/23/23.

## 2023-06-22 DIAGNOSIS — F909 Attention-deficit hyperactivity disorder, unspecified type: Secondary | ICD-10-CM | POA: Insufficient documentation

## 2023-06-23 ENCOUNTER — Ambulatory Visit: Payer: Managed Care, Other (non HMO) | Admitting: Family Medicine

## 2023-06-23 ENCOUNTER — Encounter: Payer: Self-pay | Admitting: Family Medicine

## 2023-06-23 VITALS — BP 138/86 | HR 80 | Wt 197.6 lb

## 2023-06-23 DIAGNOSIS — F1911 Other psychoactive substance abuse, in remission: Secondary | ICD-10-CM | POA: Diagnosis not present

## 2023-06-23 DIAGNOSIS — F909 Attention-deficit hyperactivity disorder, unspecified type: Secondary | ICD-10-CM

## 2023-06-23 DIAGNOSIS — F1910 Other psychoactive substance abuse, uncomplicated: Secondary | ICD-10-CM

## 2023-06-23 DIAGNOSIS — F17200 Nicotine dependence, unspecified, uncomplicated: Secondary | ICD-10-CM | POA: Diagnosis not present

## 2023-06-23 MED ORDER — AMPHETAMINE-DEXTROAMPHETAMINE 20 MG PO TABS
20.0000 mg | ORAL_TABLET | Freq: Two times a day (BID) | ORAL | 0 refills | Status: DC
Start: 2023-08-25 — End: 2023-12-15

## 2023-06-23 MED ORDER — AMPHETAMINE-DEXTROAMPHETAMINE 20 MG PO TABS
20.0000 mg | ORAL_TABLET | Freq: Two times a day (BID) | ORAL | 0 refills | Status: DC
Start: 2023-06-27 — End: 2024-04-11

## 2023-06-23 MED ORDER — AMPHETAMINE-DEXTROAMPHETAMINE 20 MG PO TABS
20.0000 mg | ORAL_TABLET | Freq: Two times a day (BID) | ORAL | 0 refills | Status: DC
Start: 2023-07-28 — End: 2023-10-06

## 2023-06-23 NOTE — Progress Notes (Signed)
   Subjective:    Patient ID: Bob Vargas, male    DOB: 1991/04/16, 33 y.o.   MRN: [id number]  HPI He is here for an interval evaluation.  He does have a history of ADHD and has been fairly stable on his Adderall.  He takes it twice per day and each pill lasts roughly 4 to 5 hours.  He has no withdrawal symptoms other than increased distractibility.  He continues to smoke and is making efforts to try and stop smoking.  He does have a previous history of drug abuse but this is not a problem at the present time.  He does have a 41-year-old child that he states has changed his attitude towards how to live his life.  Otherwise he has no particular concerns or complaints.   Review of Systems     Objective:    Physical Exam Alert and in no distress. Tympanic membranes and canals are normal. Pharyngeal area is normal. Neck is supple without adenopathy or thyromegaly. Cardiac exam shows a regular sinus rhythm without murmurs or gallops. Lungs are clear to auscultation.        Assessment & Plan:  Current smoker  Drug abuse (HCC)  Attention deficit hyperactivity disorder (ADHD), unspecified ADHD type I encouraged him to continue to work on smoking cessation and I would help him whenever he asks.

## 2023-08-10 ENCOUNTER — Encounter: Payer: Self-pay | Admitting: Internal Medicine

## 2023-10-06 ENCOUNTER — Other Ambulatory Visit: Payer: Self-pay | Admitting: Family Medicine

## 2023-10-06 DIAGNOSIS — F909 Attention-deficit hyperactivity disorder, unspecified type: Secondary | ICD-10-CM

## 2023-10-06 MED ORDER — AMPHETAMINE-DEXTROAMPHETAMINE 20 MG PO TABS
20.0000 mg | ORAL_TABLET | Freq: Two times a day (BID) | ORAL | 0 refills | Status: DC
Start: 2023-11-05 — End: 2024-04-11

## 2023-10-06 MED ORDER — AMPHETAMINE-DEXTROAMPHETAMINE 20 MG PO TABS: 20.0000 mg | ORAL_TABLET | Freq: Two times a day (BID) | ORAL | 0 refills | Status: DC

## 2023-10-06 NOTE — Telephone Encounter (Signed)
 Copied from CRM [id number]. Topic: Clinical - Medication Refill >> Oct 06, 2023 10:10 AM Clydene Darner H wrote: Most Recent Primary Care Visit:  Provider: Watson Hacking  Department: [facility]  Visit Type: MED MGMT 30  Date: 06/23/2023  Medication: ADDERALL  Has the patient contacted their pharmacy? Yes (Agent: If no, request that the patient contact the pharmacy for the refill. If patient does not wish to contact the pharmacy document the reason why and proceed with request.) (Agent: If yes, when and what did the pharmacy advise?)  Is this the correct pharmacy for this prescription? Yes If no, delete pharmacy and type the correct one.  This is the patient's preferred pharmacy:  [id number] - [place], [place] - [address] AT [address] [facility] [address] [place] [address] Phone: [phone] Fax: [phone]   Has the prescription been filled recently? Yes  Is the patient out of the medication? Yes  Has the patient been seen for an appointment in the last year OR does the patient have an upcoming appointment? Yes   Can we respond through MyChart? No  Agent: Please be advised that Rx refills may take up to 3 business days. We ask that you follow-up with your pharmacy.

## 2023-11-01 ENCOUNTER — Emergency Department (HOSPITAL_BASED_OUTPATIENT_CLINIC_OR_DEPARTMENT_OTHER)

## 2023-11-01 ENCOUNTER — Emergency Department (HOSPITAL_BASED_OUTPATIENT_CLINIC_OR_DEPARTMENT_OTHER)
Admission: EM | Admit: 2023-11-01 | Discharge: 2023-11-02 | Disposition: A | Discharge status: Home / Self Care | Attending: Emergency Medicine | Admitting: Emergency Medicine

## 2023-11-01 ENCOUNTER — Other Ambulatory Visit: Payer: Self-pay

## 2023-11-01 DIAGNOSIS — S83411A Sprain of medial collateral ligament of right knee, initial encounter: Secondary | ICD-10-CM

## 2023-11-01 DIAGNOSIS — X509XXA Other and unspecified overexertion or strenuous movements or postures, initial encounter: Secondary | ICD-10-CM | POA: Insufficient documentation

## 2023-11-01 DIAGNOSIS — Y9301 Activity, walking, marching and hiking: Secondary | ICD-10-CM | POA: Insufficient documentation

## 2023-11-01 DIAGNOSIS — S8991XA Unspecified injury of right lower leg, initial encounter: Secondary | ICD-10-CM | POA: Insufficient documentation

## 2023-11-01 LAB — CBC WITH DIFFERENTIAL/PLATELET
Abs Immature Granulocytes: 0.02 10*3/uL (ref 0.00–0.07)
Basophils Absolute: 0 10*3/uL (ref 0.0–0.1)
Basophils Relative: 1 %
Eosinophils Absolute: 0.1 10*3/uL (ref 0.0–0.5)
Eosinophils Relative: 1 %
HCT: 42 % (ref 39.0–52.0)
Hemoglobin: 14.8 g/dL (ref 13.0–17.0)
Immature Granulocytes: 0 %
Lymphocytes Relative: 35 %
Lymphs Abs: 3.1 10*3/uL (ref 0.7–4.0)
MCH: 32.1 pg (ref 26.0–34.0)
MCHC: 35.2 g/dL (ref 30.0–36.0)
MCV: 91.1 fL (ref 80.0–100.0)
Monocytes Absolute: 0.6 10*3/uL (ref 0.1–1.0)
Monocytes Relative: 6 %
Neutro Abs: 5 10*3/uL (ref 1.7–7.7)
Neutrophils Relative %: 57 %
Platelets: 188 10*3/uL (ref 150–400)
RBC: 4.61 MIL/uL (ref 4.22–5.81)
RDW: 11.9 % (ref 11.5–15.5)
WBC: 8.8 10*3/uL (ref 4.0–10.5)
nRBC: 0 % (ref 0.0–0.2)

## 2023-11-01 LAB — COMPREHENSIVE METABOLIC PANEL WITH GFR
ALT: 18 U/L (ref 0–44)
AST: 28 U/L (ref 15–41)
Albumin: 4.7 g/dL (ref 3.5–5.0)
Alkaline Phosphatase: 89 U/L (ref 38–126)
Anion gap: 16 — ABNORMAL HIGH (ref 5–15)
BUN: 12 mg/dL (ref 6–20)
CO2: 21 mmol/L — ABNORMAL LOW (ref 22–32)
Calcium: 9.3 mg/dL (ref 8.9–10.3)
Chloride: 101 mmol/L (ref 98–111)
Creatinine, Ser: 0.99 mg/dL (ref 0.61–1.24)
GFR, Estimated: >60 mL/min (ref >60)
Glucose, Bld: 92 mg/dL (ref 70–99)
Potassium: 4 mmol/L (ref 3.5–5.1)
Sodium: 138 mmol/L (ref 135–145)
Total Bilirubin: 0.4 mg/dL (ref 0.0–1.2)
Total Protein: 7.3 g/dL (ref 6.5–8.1)

## 2023-11-01 LAB — LACTIC ACID, PLASMA
Lactic Acid, Venous: 0.6 mmol/L (ref 0.5–1.9)
Lactic Acid, Venous: 0.6 mmol/L (ref 0.5–1.9)

## 2023-11-01 MED ORDER — HYDROCODONE-ACETAMINOPHEN 5-325 MG PO TABS
2.0000 | ORAL_TABLET | Freq: Once | ORAL | Status: AC
  Administered 2023-11-01: 2 via ORAL
  Filled 2023-11-01: qty 2

## 2023-11-01 MED ORDER — HYDROCODONE-ACETAMINOPHEN 5-325 MG PO TABS
1.0000 | ORAL_TABLET | Freq: Once | ORAL | Status: AC
  Administered 2023-11-01: 1 via ORAL
  Filled 2023-11-01: qty 1

## 2023-11-01 MED ORDER — HYDROCODONE-ACETAMINOPHEN 5-325 MG PO TABS: 1.0000 | ORAL_TABLET | Freq: Four times a day (QID) | ORAL | 0 refills | Status: DC | PRN

## 2023-11-01 MED ORDER — NAPROXEN 500 MG PO TABS: 500.0000 mg | ORAL_TABLET | Freq: Two times a day (BID) | ORAL | 0 refills | Status: DC

## 2023-11-01 NOTE — ED Provider Triage Note (Signed)
 Emergency Medicine Provider Triage Evaluation Note  Bob Vargas , a 33 y.o. male  was evaluated in triage.  Pt complains of knee pain.  Review of Systems  Positive: Swelling, pain and recent injury Negative: fever  Physical Exam  There were no vitals taken for this visit. Gen:   Awake, no distress   Resp:  Normal effort  MSK:   Moves extremities without difficulty  Other:  Right knee moderately swollen. There is erythema anteriorly but not circumferentially. Calf swelling without redness or posterior tenderness.   Medical Decision Making  Medically screening exam initiated at 7:13 PM.  Appropriate orders placed.  NIELS CRANSHAW was informed that the remainder of the evaluation will be completed by another provider, this initial triage assessment does not replace that evaluation, and the importance of remaining in the ED until their evaluation is complete.  Patient twisted knee 2 days ago, c/w in history with ?patellar dislocation. Has had multiple knee injuries in the past. Seen by orthopedics and sent to ED with concern for septic joint vs DVT.   Mandy Second, PA-C 11/01/23 1916

## 2023-11-01 NOTE — Discharge Instructions (Signed)
 Begin taking naproxen  as prescribed.  Begin taking hydrocodone  as prescribed as needed for pain not relieved with naproxen .  Wear knee immobilizer for comfort and support.  An MRI has been ordered.  Radiology should call you to make these arrangements.

## 2023-11-01 NOTE — ED Provider Notes (Signed)
 Bob Vargas Provider Note   CSN: [id number] Arrival date & time: 11/01/23  1824     History  Chief Complaint  Patient presents with   Knee Pain    Bob Vargas is a 33 y.o. male.  The patient is a 33 year old male with past medical history of ADHD.  Patient presenting today with complaints of a right knee injury.  He reports walking up stairs 2 days ago when he felt his knee pop, then he collapsed.  He has been having difficulty ambulating since.  He describes some swelling to the front of the knee extending down the leg.  He was seen at orthopedic urgent care, then referred here for ultrasound and rule out of blood clot/septic joint.  Patient denies any fevers.       Home Medications Prior to Admission medications   Medication Sig Start Date End Date Taking? Authorizing Provider  amphetamine -dextroamphetamine  (ADDERALL) 20 MG tablet Take 1 tablet (20 mg total) by mouth 2 (two) times daily. 08/25/23   Watson Hacking, MD  amphetamine -dextroamphetamine  (ADDERALL) 20 MG tablet Take 1 tablet (20 mg total) by mouth 2 (two) times daily. 06/27/23   Watson Hacking, MD  amphetamine -dextroamphetamine  (ADDERALL) 20 MG tablet Take 1 tablet (20 mg total) by mouth 2 (two) times daily. 12/06/23   Watson Hacking, MD  amphetamine -dextroamphetamine  (ADDERALL) 20 MG tablet Take 1 tablet (20 mg total) by mouth 2 (two) times daily. 10/06/23   Watson Hacking, MD  amphetamine -dextroamphetamine  (ADDERALL) 20 MG tablet Take 1 tablet (20 mg total) by mouth 2 (two) times daily. 11/05/23   Watson Hacking, MD  cyclobenzaprine  (FEXMID ) 7.5 MG tablet Take 1 tablet (7.5 mg total) by mouth 3 (three) times daily as needed for muscle spasms. Patient not taking: Reported on 06/23/2023 01/17/21   Schulz, Zachary R, PA-C  escitalopram  (LEXAPRO ) 20 MG tablet Take 1 tablet (20 mg total) by mouth daily. Patient not taking: Reported on 06/23/2023 01/15/21   Watson Hacking, MD   gabapentin  (NEURONTIN ) 300 MG capsule Take 1 capsule (300 mg total) by mouth 3 (three) times daily for 90 doses. 01/02/21 02/01/21  Piscoya, Jose, MD  hydrOXYzine (VISTARIL) 50 MG capsule Take 100 mg by mouth every 8 (eight) hours as needed. Patient not taking: Reported on 06/25/2022 12/18/20   [provider]  ibuprofen  (ADVIL ) 800 MG tablet Take 1 tablet (800 mg total) by mouth every 8 (eight) hours as needed for moderate pain. Patient not taking: Reported on 06/23/2023 01/02/21   Emmalene Hare, MD  methocarbamol (ROBAXIN) 500 MG tablet Take 500 mg by mouth 3 (three) times daily as needed for muscle spasms. Patient not taking: Reported on 06/25/2022    [provider]  ondansetron  (ZOFRAN ) 8 MG tablet Take 1 tablet (8 mg total) by mouth every 8 (eight) hours as needed for nausea or vomiting. Patient not taking: Reported on 06/23/2023 06/25/22   Watson Hacking, MD  pramipexole (MIRAPEX) 0.125 MG tablet Take 0.125 mg by mouth at bedtime. Patient not taking: Reported on 06/25/2022    [provider]  QUEtiapine (SEROQUEL) 200 MG tablet Take 200 mg by mouth. Patient not taking: Reported on 06/25/2022    [provider]      Allergies    Patient has no known allergies.    Review of Systems   Review of Systems  All other systems reviewed and are negative.   Physical Exam Updated Vital Signs BP 139/81  Pulse 84   Temp (!) 97.1 F (36.2 C) (Oral)   Resp 17   SpO2 99%  Physical Exam Vitals and nursing note reviewed.  Constitutional:      Appearance: Normal appearance.  Pulmonary:     Effort: Pulmonary effort is normal.  Musculoskeletal:     Comments: The right knee does have a small effusion.  There is tenderness over the medial aspect of the knee along with some laxity of the MCL on exam.  Anterior and posterior drawer test is negative.  There is no calf tenderness and no edema of the leg.  There is no erythema or warmth.  Skin:    General: Skin is warm and  dry.  Neurological:     Mental Status: He is alert and oriented to person, place, and time.     ED Results / Procedures / Treatments   Labs (all labs ordered are listed, but only abnormal results are displayed) Labs Reviewed  COMPREHENSIVE METABOLIC PANEL WITH GFR - Abnormal; Notable for the following components:      Result Value   CO2 21 (*)    Anion gap 16 (*)    All other components within normal limits  LACTIC ACID, PLASMA  LACTIC ACID, PLASMA  CBC WITH DIFFERENTIAL/PLATELET    EKG None  Radiology US  Venous Img Lower Unilateral Right Result Date: 11/01/2023 CLINICAL DATA:  Right knee pain, swelling EXAM: RIGHT LOWER EXTREMITY VENOUS DOPPLER ULTRASOUND TECHNIQUE: Gray-scale sonography with compression, as well as color and duplex ultrasound, were performed to evaluate the deep venous system(s) from the level of the common femoral vein through the popliteal and proximal calf veins. COMPARISON:  None Available. FINDINGS: VENOUS Normal compressibility of the common femoral, superficial femoral, and popliteal veins, as well as the visualized calf veins. Visualized portions of profunda femoral vein and great saphenous vein unremarkable. No filling defects to suggest DVT on grayscale or color Doppler imaging. Doppler waveforms show normal direction of venous flow, normal respiratory plasticity and response to augmentation. Limited views of the contralateral common femoral vein are unremarkable. OTHER None. Limitations: none IMPRESSION: Negative. Electronically Signed   By: Janeece Mechanic M.D.   On: 11/01/2023 21:49    Procedures Procedures    Medications Ordered in ED Medications  HYDROcodone -acetaminophen  (NORCO/VICODIN) 5-325 MG per tablet 2 tablet (has no administration in time range)  HYDROcodone -acetaminophen  (NORCO/VICODIN) 5-325 MG per tablet 1 tablet (1 tablet Oral Given 11/01/23 1932)    ED Course/ Medical Decision Making/ A&P  Patient sent for further evaluation of a  right knee injury as described in the HPI.  He had x-rays performed at the orthopedic urgent care showing some arthritis, but no acute findings.  Patient's ultrasound here today is negative for DVT.  His laboratory studies reveal no leukocytosis and normal lactate.  Patient is afebrile and clinically well-appearing and I highly doubt a septic joint.  Based on his exam findings, I suspect either a medial collateral sprain or possible tear and will order an MRI for the patient as an outpatient.  He will be discharged with a knee immobilizer, work excuse, and medication for inflammation and pain.  Final Clinical Impression(s) / ED Diagnoses Final diagnoses:  None    Rx / DC Orders ED Discharge Orders     None         Orvilla Blander, MD 11/01/23 2323

## 2023-11-01 NOTE — ED Triage Notes (Signed)
 Sent for knee injury on Saturday. Saw ortho today and unable to assess. Possibly torn meniscus of right knee. Sent for concern of infection of knee vs blood clot. Original injury has no open wound.

## 2023-11-09 ENCOUNTER — Telehealth: Payer: Self-pay

## 2023-11-09 ENCOUNTER — Other Ambulatory Visit: Payer: Self-pay | Admitting: Family Medicine

## 2023-11-09 DIAGNOSIS — F909 Attention-deficit hyperactivity disorder, unspecified type: Secondary | ICD-10-CM

## 2023-11-09 NOTE — Telephone Encounter (Unsigned)
 Copied from CRM [id number]. Topic: Referral - Request for Referral >> Nov 09, 2023  2:09 PM Zipporah Him wrote: Did the patient discuss referral with their provider in the last year? No   Appointment offered? Yes  Type of order/referral and detailed reason for visit: MRI of Knee, [place], due to Injury  Preference of office, provider, location: MRI of Knee, [facility], due to Injury   Patient was scheduled for MRI through ER referral for 5/28, but insurance prior authorization was never completed. Patient got a call today stating that his appointment was cancelled due to not having insurance approval. They suggested seeing if his PCP could send over the referral for him.   Can we respond through MyChart? No, patient requests a call back today if at all possible because he would like to know if he can still have imaging completed tomorrow.

## 2023-11-09 NOTE — Telephone Encounter (Signed)
 Last Fill: 11/05/23 60 tabs/0 RF  Last OV: 06/23/23 Next OV: 01/04/24  Routing to provider for review/authorization.   Copied from CRM [id number]. Topic: Clinical - Medication Refill >> Nov 09, 2023 11:53 AM Earnestine Goes B wrote: Medication: amphetamine -dextroamphetamine  (ADDERALL) 20 MG tablet  Has the patient contacted their pharmacy? Yes (Agent: If no, request that the patient contact the pharmacy for the refill. If patient does not wish to contact the pharmacy document the reason why and proceed with request.) (Agent: If yes, when and what did the pharmacy advise?)  This is the patient's preferred pharmacy:  [id number] - [place], [place] - [address] AT [address] OF [facility] [address] [place] [address] Phone: [phone] Fax: [phone]  Is this the correct pharmacy for this prescription? Yes If no, delete pharmacy and type the correct one.   Has the prescription been filled recently? Yes  Is the patient out of the medication? Yes  Has the patient been seen for an appointment in the last year OR does the patient have an upcoming appointment? Yes  Can we respond through MyChart? Yes  Agent: Please be advised that Rx refills may take up to 3 business days. We ask that you follow-up with your pharmacy.

## 2023-11-09 NOTE — Telephone Encounter (Signed)
 Copied from CRM [id number]. Topic: Clinical - Medication Refill >> Nov 09, 2023 11:53 AM Earnestine Goes B wrote: Medication: amphetamine -dextroamphetamine  (ADDERALL) 20 MG tablet  Has the patient contacted their pharmacy? Yes (Agent: If no, request that the patient contact the pharmacy for the refill. If patient does not wish to contact the pharmacy document the reason why and proceed with request.) (Agent: If yes, when and what did the pharmacy advise?)  This is the patient's preferred pharmacy:  [id number] - [place], [place] - [address] AT [address] OF [facility] [address] [place] [address] Phone: [phone] Fax: [phone]  Is this the correct pharmacy for this prescription? Yes If no, delete pharmacy and type the correct one.   Has the prescription been filled recently? Yes  Is the patient out of the medication? Yes  Has the patient been seen for an appointment in the last year OR does the patient have an upcoming appointment? Yes  Can we respond through MyChart? Yes  Agent: Please be advised that Rx refills may take up to 3 business days. We ask that you follow-up with your pharmacy.

## 2023-11-10 ENCOUNTER — Ambulatory Visit (HOSPITAL_COMMUNITY)

## 2023-11-10 NOTE — Telephone Encounter (Signed)
 Tell him to check at the drugstore

## 2023-11-15 NOTE — Telephone Encounter (Signed)
 Pt scheduled appt

## 2023-11-17 ENCOUNTER — Ambulatory Visit: Admitting: Family Medicine

## 2023-11-17 ENCOUNTER — Encounter: Payer: Self-pay | Admitting: Family Medicine

## 2023-11-17 VITALS — BP 130/80 | HR 96 | Ht 69.0 in | Wt 196.4 lb

## 2023-11-17 DIAGNOSIS — F900 Attention-deficit hyperactivity disorder, predominantly inattentive type: Secondary | ICD-10-CM | POA: Diagnosis not present

## 2023-11-17 DIAGNOSIS — M25461 Effusion, right knee: Secondary | ICD-10-CM | POA: Diagnosis not present

## 2023-11-17 DIAGNOSIS — S83411A Sprain of medial collateral ligament of right knee, initial encounter: Secondary | ICD-10-CM

## 2023-11-17 DIAGNOSIS — S83206A Unspecified tear of unspecified meniscus, current injury, right knee, initial encounter: Secondary | ICD-10-CM

## 2023-11-17 DIAGNOSIS — R29898 Other symptoms and signs involving the musculoskeletal system: Secondary | ICD-10-CM | POA: Diagnosis not present

## 2023-11-17 NOTE — Progress Notes (Signed)
   Subjective:    Patient ID: Bob Vargas, male    DOB: [birth year], 33 y.o.   MRN: [id number]  HPI He is here for a consult after injuring his right knee in a twisting motion while at home apparently going up steps.  He was seen in an orthopedic emergent care and then sent to the emergency room to make sure he did not have DVT.  The exam showed no DVT.  They did mention that they thought he had an effusion and possible meniscal damage and a positive anterior drawer. He also has underlying ADHD and is doing well on his present medication.  1 pill lasts about 6 hours that he needs to take 2 a day to get through the day and has no real major symptoms.   Review of Systems     Objective:    Physical Exam Alert and in no distress.  Exam of the right knee does show an effusion.  He does have a positive anterior drawer.  Medial collateral ligament is loose even at full extension.  McMurray's testing causes discomfort medially.       Assessment & Plan:  Effusion of right knee  Attention deficit hyperactivity disorder (ADHD), predominantly inattentive type  Positive anterior drawer test of knee joint  Sprain of medial collateral ligament of right knee, initial encounter  Positive McMurray test of right knee, initial encounter His symptoms are certainly suggestive of the terrible triad and I explained this to him.  He will be set up for an MRI

## 2023-11-18 ENCOUNTER — Telehealth: Payer: Self-pay

## 2023-11-18 NOTE — Addendum Note (Signed)
 Addended by: Watson Hacking on: 11/18/2023 01:58 PM   Modules accepted: Orders

## 2023-11-18 NOTE — Telephone Encounter (Signed)
 Copied from CRM [id number]. Topic: Clinical - Request for Lab/Test Order >> Nov 18, 2023 11:23 AM Zipporah Him wrote: Reason for CRM: Patient is calling in as his MRI was submitted for his left knee, but it is supposed to the for the right knee. He was not able to schedule due to this, a new order must be sent over for imaging stating the right knee. He requests a call back when the new order has been placed.

## 2023-12-02 ENCOUNTER — Ambulatory Visit
Admission: RE | Admit: 2023-12-02 | Discharge: 2023-12-02 | Disposition: A | Discharge status: Home / Self Care | Source: Ambulatory Visit | Attending: Family Medicine | Admitting: Family Medicine

## 2023-12-02 ENCOUNTER — Other Ambulatory Visit: Payer: Self-pay | Admitting: Family Medicine

## 2023-12-02 ENCOUNTER — Ambulatory Visit: Payer: Self-pay | Admitting: Family Medicine

## 2023-12-02 DIAGNOSIS — R29898 Other symptoms and signs involving the musculoskeletal system: Secondary | ICD-10-CM

## 2023-12-02 DIAGNOSIS — S83206A Unspecified tear of unspecified meniscus, current injury, right knee, initial encounter: Secondary | ICD-10-CM

## 2023-12-02 DIAGNOSIS — S83411A Sprain of medial collateral ligament of right knee, initial encounter: Secondary | ICD-10-CM

## 2023-12-02 DIAGNOSIS — Z77018 Contact with and (suspected) exposure to other hazardous metals: Secondary | ICD-10-CM

## 2023-12-02 DIAGNOSIS — M25461 Effusion, right knee: Secondary | ICD-10-CM

## 2023-12-02 NOTE — Progress Notes (Signed)
 Patient referred on 11/17/2023

## 2023-12-06 ENCOUNTER — Encounter: Payer: Self-pay | Admitting: Orthopedic Surgery

## 2023-12-06 ENCOUNTER — Ambulatory Visit: Admitting: Orthopedic Surgery

## 2023-12-06 ENCOUNTER — Other Ambulatory Visit (INDEPENDENT_AMBULATORY_CARE_PROVIDER_SITE_OTHER): Payer: Self-pay

## 2023-12-06 DIAGNOSIS — M25561 Pain in right knee: Secondary | ICD-10-CM

## 2023-12-06 NOTE — Progress Notes (Signed)
 Office Visit Note   Patient: Bob Vargas           Date of Birth: [birth year]           MRN: [id number] Visit Date: 12/06/2023 Requested by: Joyce Norleen BROCKS, MD [address] [place],  [place] [address] PCP: Joyce Norleen BROCKS, MD  Subjective: Chief Complaint  Patient presents with   Right Knee - Pain    HPI: Bob Vargas is a 33 y.o. male who presents to the office reporting right knee pain and instability.  Patient injured his knee a month ago with a twisting injury going up and down stairs.  He does report some locking popping stiffness catching and swelling with activity.  He works as a Psychologist, occupational.  Describes medial pain.  Knee brace helps due to feelings of instability.  He has had 2 prior surgeries in the right knee in [place]  in 2016 and 2017.  Notably he has also had an injury which was a laceration to the front part of his knee which extends about 4 fingerbreadths proximal to the superior pole of the patella down to the inferior pole of the patella.  He has had an ultrasound which ruled out deep vein thrombosis.  Reports primarily medial sided tightness.  MRI scan is reviewed.  Does show complex lateral meniscal tear along with medial meniscal tear.  MCL also torn along with ACL.  Effusion is present..                ROS: All systems reviewed are negative as they relate to the chief complaint within the history of present illness.  Patient denies fevers or chills.  Assessment & Plan: Visit Diagnoses:  1. Right knee pain, unspecified chronicity     Plan: Impression and multiligament injury in the knee and the patient who is a Psychologist, occupational.  He is able to stand and do work.  I think with multiligament reconstruction he should be able to be back to doing standing type activities after 2 to possibly 3 months.  His best bet for graft would be either allograft bone patellar tendon bone versus autograft hamstring.  I think the incision over his EXTR tensor mechanism from his  prior injury really precludes either quad or bone patellar tendon bone grafting.  Meniscal work would also have to be done.  The risk and benefits of the procedure discussed with the patient include not limited to infection or vessel damage knee stiffness as well as the prolonged rehabilitation required.  He is going to check on his short-term disability at work.  I think he could be back doing functional work after 2 to 3 months.  He will let us  know when he wants to schedule.  He does have good range of motion right now but does have some laxity to ACL and MCL stress testing.  Follow-Up Instructions: No follow-ups on file.   Orders:  Orders Placed This Encounter  Procedures   XR KNEE 3 VIEW RIGHT   No orders of the defined types were placed in this encounter.     Procedures: No procedures performed   Clinical Data: No additional findings.  Objective: Vital Signs: There were no vitals taken for this visit.  Physical Exam:  Constitutional: Patient appears well-developed HEENT:  Head: Normocephalic Eyes:EOM are normal Neck: Normal range of motion Cardiovascular: Normal rate Pulmonary/chest: Effort normal Neurologic: Patient is alert Skin: Skin is warm Psychiatric: Patient has normal mood and affect  Ortho Exam: Ortho exam  demonstrates palpable pedal pulses.  He does have full extension and bends to about 110 on the right 120 on the left.  Effusion is present.  Negative patellar apprehension with no tenderness over the medial aspect of the patella.  Does have some laxity to valgus stress at 0 and 30 degrees on the right of 3 mm and 5 mm compared to 1 mm and 3 mm on the left.  ACL laxity with positive anterior drawer and positive Lachman is present on the right-hand side compared to the left.  There is no posterolateral rotatory instability and the lateral collateral ligament is palpable with varus stress.  Extensor mechanism is intact.  Incision extending about 4 cm proximal to the  superior pole of the patella down to the inferior pole of the patella is present.  This was some of prior open type injury.  There is a mild to moderate effusion in the right knee.  Specialty Comments:  No specialty comments available.  Imaging: No results found.   PMFS History: Patient Active Problem List   Diagnosis Date Noted   Attention deficit hyperactivity disorder (ADHD) 06/22/2023   History of hernia surgery 01/15/2021   Drug abuse (HCC) 01/15/2021   History of hepatitis B 06/10/2020   Current smoker 07/30/2015   Hydrocele in adult 07/30/2015   Past Medical History:  Diagnosis Date   ADHD (attention deficit hyperactivity disorder)    Allergy    Headache(784.0)    Hepatitis C    per pt cleared on its own   MRSA infection (methicillin-resistant Staphylococcus aureus)    has had 3 times in past   Polysubstance abuse (HCC)    BZO, ETOH, marijuana, opioids    Family History  Problem Relation Age of Onset   Healthy Mother    Hyperlipidemia Father    Alcoholism Father     Past Surgical History:  Procedure Laterality Date   FOREIGN BODY REMOVAL Right 10/06/2013   Procedure: FOREIGN BODY REMOVAL ADULT right index finger;  Surgeon: Donnice DELENA Robinsons, MD;  Location: [facility];  Service: Orthopedics;  Laterality: Right;   KNEE ARTHROSCOPY Right    KNEE SURGERY Right 07/06/2015   MYRINGOTOMY     XI ROBOTIC ASSISTED INGUINAL HERNIA REPAIR WITH MESH Right 01/02/2021   Procedure: XI ROBOTIC ASSISTED INGUINAL HERNIA REPAIR WITH MESH;  Surgeon: Desiderio Schanz, MD;  Location: [facility];  Service: General;  Laterality: Right;   Social History   Occupational History   Not on file  Tobacco Use   Smoking status: Every Day    Current packs/day: 0.25    Types: Cigarettes   Smokeless tobacco: Never  Vaping Use   Vaping status: Every Day  Substance and Sexual Activity   Alcohol use: Not Currently    Alcohol/week: 14.0 standard drinks of alcohol    Types: 14 Cans  of beer per week   Drug use: Not Currently    Types: IV, Marijuana    Comment: Heroin with Fentanyl    Sexual activity: Yes

## 2023-12-15 ENCOUNTER — Other Ambulatory Visit: Payer: Self-pay | Admitting: Family Medicine

## 2023-12-15 DIAGNOSIS — F909 Attention-deficit hyperactivity disorder, unspecified type: Secondary | ICD-10-CM

## 2023-12-15 NOTE — Telephone Encounter (Signed)
 Copied from CRM [id number]. Topic: Clinical - Medication Refill >> Dec 15, 2023 12:16 PM Selinda RAMAN wrote: Medication: amphetamine -dextroamphetamine  (ADDERALL) 20 MG tablet  Has the patient contacted their pharmacy? No   This is the patient's preferred pharmacy:  [id number] - [place], [place] - [address] AT [address] OF [facility] [address] [place] [address] Phone: [phone] Fax: [phone]  Is this the correct pharmacy for this prescription? Yes If no, delete pharmacy and type the correct one.   Has the prescription been filled recently? No  Is the patient out of the medication? No but but he only has one day left  Has the patient been seen for an appointment in the last year OR does the patient have an upcoming appointment? Yes  Can we respond through MyChart? Yes  Please assist patient further

## 2023-12-24 MED ORDER — AMPHETAMINE-DEXTROAMPHETAMINE 20 MG PO TABS: 20.0000 mg | ORAL_TABLET | Freq: Two times a day (BID) | ORAL | 0 refills | Status: DC

## 2024-01-03 ENCOUNTER — Telehealth: Payer: Self-pay

## 2024-01-03 ENCOUNTER — Other Ambulatory Visit: Payer: Self-pay | Admitting: Surgical

## 2024-01-03 ENCOUNTER — Encounter: Payer: Self-pay | Admitting: Orthopedic Surgery

## 2024-01-03 DIAGNOSIS — S83511A Sprain of anterior cruciate ligament of right knee, initial encounter: Secondary | ICD-10-CM | POA: Diagnosis not present

## 2024-01-03 DIAGNOSIS — S83411A Sprain of medial collateral ligament of right knee, initial encounter: Secondary | ICD-10-CM | POA: Diagnosis not present

## 2024-01-03 DIAGNOSIS — S83271A Complex tear of lateral meniscus, current injury, right knee, initial encounter: Secondary | ICD-10-CM | POA: Diagnosis not present

## 2024-01-03 MED ORDER — ASPIRIN 81 MG PO CHEW: 81.0000 mg | CHEWABLE_TABLET | Freq: Two times a day (BID) | ORAL | 0 refills | Status: AC

## 2024-01-03 MED ORDER — CELECOXIB 100 MG PO CAPS: 100.0000 mg | ORAL_CAPSULE | Freq: Two times a day (BID) | ORAL | 0 refills | Status: DC

## 2024-01-03 MED ORDER — OXYCODONE HCL 5 MG PO TABS: 5.0000 mg | ORAL_TABLET | ORAL | 0 refills | Status: DC | PRN

## 2024-01-03 MED ORDER — METHOCARBAMOL 500 MG PO TABS: 500.0000 mg | ORAL_TABLET | Freq: Three times a day (TID) | ORAL | 1 refills | Status: DC | PRN

## 2024-01-03 NOTE — Telephone Encounter (Signed)
 Patients mom called concerning leg machine for patient.  Talked with Marval concerning this and Rx was sent to [facility].  Stated that patient is having pain on the left side of his right knee and that the pain is worse than before.  Patient had right knee surgery today.  CB# [id number].  Please advise.  Thank you.

## 2024-01-04 ENCOUNTER — Ambulatory Visit: Admitting: Family Medicine

## 2024-01-04 NOTE — Telephone Encounter (Signed)
 Herlene talked with patients mom yesterday. I also spoke with Katheryn yesterday and she was going to follow up with [facility] to have them reach out to patient.

## 2024-01-10 ENCOUNTER — Encounter: Payer: Self-pay | Admitting: Surgical

## 2024-01-10 ENCOUNTER — Ambulatory Visit (INDEPENDENT_AMBULATORY_CARE_PROVIDER_SITE_OTHER): Admitting: Surgical

## 2024-01-10 DIAGNOSIS — Z9889 Other specified postprocedural states: Secondary | ICD-10-CM

## 2024-01-10 MED ORDER — OXYCODONE HCL 5 MG PO TABS
5.0000 mg | ORAL_TABLET | Freq: Four times a day (QID) | ORAL | 0 refills | Status: DC | PRN
Start: 2024-01-10 — End: 2024-01-21

## 2024-01-10 MED ORDER — METHOCARBAMOL 500 MG PO TABS: 500.0000 mg | ORAL_TABLET | Freq: Three times a day (TID) | ORAL | 1 refills | Status: DC | PRN

## 2024-01-10 NOTE — Progress Notes (Signed)
 Post-Op Visit Note   Patient: Bob Vargas           Date of Birth: 1990/11/10           MRN: [id number] Visit Date: 01/10/2024 PCP: Joyce Norleen BROCKS, MD   Assessment & Plan:  Chief Complaint:  Chief Complaint  Patient presents with   Right Knee - Routine Post Op    01/03/2024 Right knee ACL reconstruction   Visit Diagnoses: No diagnosis found.  Plan: Patient is a 33 year old male who presents s/p right knee anterior cruciate ligament reconstruction using bone patellar tendon bone allograft and augmentation with B MAC from iliac crest along with meniscal repair on 01/03/2024.  He has been nonweightbearing since surgery, ambulating with crutches.  Has no chest pain or shortness of breath.  Does have some calf pain that seems more nerve related with hyperesthesia around the medial aspect of the medial sided incision.  He is using CPM machine.  No fevers or chills.  Up to 70 degrees on the CPM.  Taking oxycodone , Tylenol , methocarbamol , Celebrex .  On exam, patient has 0 degrees extension and about 60 degrees of knee flexion passively.  He is able to perform straight leg raise without extensor lag.  Excellent quad strength.  Quad is firing on exam.  ACL graft is stable on Lachman exam.  There is mild to moderate effusion present in the knee.  Incisions are healing well with sutures intact.  Sutures removed and replaced with Steri-Strips today.  He has reproducible discomfort and hyperesthesia with light touch over the skin medial to the medial side of the incision.  He does have some mild calf tenderness over the medial proximal calf.  Palpable DP pulse.  Negative Homans' sign.  Plan at this time is continue with nonweightbearing status.  Follow-up in 3 weeks for clinical recheck.  Refilled pain medications today.  We did discuss right lower extremity ultrasound to rule out DVT given the calf tenderness he has today.  I did recommend ultrasound but he states he has had previous ultrasounds  that was negative before his surgery when he initially injured his knee and this was negative and ended up costing him about $500.  He would like to avoid ultrasound for now but strongly encouraged patient that if his Pain persists or worsens or he notices continued calf swelling, he should call the office to get scheduled for ultrasound or go straight to emergency department especially if he develops any chest pain or shortness of breath.  He was counseled on the risk of DVT/PE and the potential outcomes including death.  Follow-Up Instructions: No follow-ups on file.   Orders:  No orders of the defined types were placed in this encounter.  Meds ordered this encounter  Medications   oxyCODONE  (ROXICODONE ) 5 MG immediate release tablet    Sig: Take 1 tablet (5 mg total) by mouth every 6 (six) hours as needed for severe pain (pain score 7-10).    Dispense:  30 tablet    Refill:  0   methocarbamol  (ROBAXIN ) 500 MG tablet    Sig: Take 1 tablet (500 mg total) by mouth every 8 (eight) hours as needed for muscle spasms.    Dispense:  30 tablet    Refill:  1    Imaging: No results found.  PMFS History: Patient Active Problem List   Diagnosis Date Noted   Attention deficit hyperactivity disorder (ADHD) 06/22/2023   History of hernia surgery 01/15/2021   Drug abuse (  HCC) 01/15/2021   History of hepatitis B 06/10/2020   Current smoker 07/30/2015   Hydrocele in adult 07/30/2015   Past Medical History:  Diagnosis Date   ADHD (attention deficit hyperactivity disorder)    Allergy    Headache(784.0)    Hepatitis C    per pt cleared on its own   MRSA infection (methicillin-resistant Staphylococcus aureus)    has had 3 times in past   Polysubstance abuse (HCC)    BZO, ETOH, marijuana, opioids    Family History  Problem Relation Age of Onset   Healthy Mother    Hyperlipidemia Father    Alcoholism Father     Past Surgical History:  Procedure Laterality Date   FOREIGN BODY REMOVAL  Right 10/06/2013   Procedure: FOREIGN BODY REMOVAL ADULT right index finger;  Surgeon: Donnice DELENA Robinsons, MD;  Location: [facility];  Service: Orthopedics;  Laterality: Right;   KNEE ARTHROSCOPY Right    KNEE SURGERY Right 07/06/2015   MYRINGOTOMY     XI ROBOTIC ASSISTED INGUINAL HERNIA REPAIR WITH MESH Right 01/02/2021   Procedure: XI ROBOTIC ASSISTED INGUINAL HERNIA REPAIR WITH MESH;  Surgeon: Desiderio Schanz, MD;  Location: [facility];  Service: General;  Laterality: Right;   Social History   Occupational History   Not on file  Tobacco Use   Smoking status: Every Day    Current packs/day: 0.25    Types: Cigarettes   Smokeless tobacco: Never  Vaping Use   Vaping status: Every Day  Substance and Sexual Activity   Alcohol use: Not Currently    Alcohol/week: 14.0 standard drinks of alcohol    Types: 14 Cans of beer per week   Drug use: Not Currently    Types: IV, Marijuana    Comment: Heroin with Fentanyl    Sexual activity: Yes

## 2024-01-21 ENCOUNTER — Telehealth: Payer: Self-pay | Admitting: Family Medicine

## 2024-01-21 ENCOUNTER — Other Ambulatory Visit: Payer: Self-pay | Admitting: Surgical

## 2024-01-21 ENCOUNTER — Encounter: Payer: Self-pay | Admitting: Family Medicine

## 2024-01-21 DIAGNOSIS — F909 Attention-deficit hyperactivity disorder, unspecified type: Secondary | ICD-10-CM

## 2024-01-21 MED ORDER — OXYCODONE HCL 5 MG PO TABS: 5.0000 mg | ORAL_TABLET | Freq: Three times a day (TID) | ORAL | 0 refills | Status: DC | PRN

## 2024-01-21 NOTE — Telephone Encounter (Signed)
 Can we do prior authorization for this medication?

## 2024-01-21 NOTE — Telephone Encounter (Signed)
 SABRA

## 2024-01-21 NOTE — Telephone Encounter (Unsigned)
 Copied from CRM [id number]. Topic: Clinical - Medication Refill >> Jan 21, 2024  2:07 PM Wess RAMAN wrote: Medication: amphetamine -dextroamphetamine  (ADDERALL) 20 MG tablet   Has the patient contacted their pharmacy? No (Agent: If no, request that the patient contact the pharmacy for the refill. If patient does not wish to contact the pharmacy document the reason why and proceed with request.) (Agent: If yes, when and what did the pharmacy advise?)  This is the patient's preferred pharmacy:  [id number] - [place], [place] - [address] AT [address] OF [address] [place] [address] Phone: [phone] Fax: [phone]  Is this the correct pharmacy for this prescription? Yes If no, delete pharmacy and type the correct one.   Has the prescription been filled recently? Yes  Is the patient out of the medication? Yes  Has the patient been seen for an appointment in the last year OR does the patient have an upcoming appointment? Yes  Can we respond through MyChart? Yes  Agent: Please be advised that Rx refills may take up to 3 business days. We ask that you follow-up with your pharmacy.

## 2024-01-25 ENCOUNTER — Telehealth: Payer: Self-pay

## 2024-01-25 ENCOUNTER — Other Ambulatory Visit (HOSPITAL_COMMUNITY): Payer: Self-pay

## 2024-01-26 ENCOUNTER — Other Ambulatory Visit: Payer: Self-pay | Admitting: Surgical

## 2024-01-26 MED ORDER — METHOCARBAMOL 500 MG PO TABS: 500.0000 mg | ORAL_TABLET | Freq: Three times a day (TID) | ORAL | 1 refills | Status: DC | PRN

## 2024-01-30 ENCOUNTER — Other Ambulatory Visit: Payer: Self-pay | Admitting: Surgical

## 2024-01-31 ENCOUNTER — Ambulatory Visit (INDEPENDENT_AMBULATORY_CARE_PROVIDER_SITE_OTHER): Payer: Self-pay | Admitting: Surgical

## 2024-01-31 ENCOUNTER — Encounter: Payer: Self-pay | Admitting: Surgical

## 2024-01-31 DIAGNOSIS — Z9889 Other specified postprocedural states: Secondary | ICD-10-CM

## 2024-01-31 NOTE — Progress Notes (Signed)
 Post-Op Visit Note   Patient: Bob Vargas           Date of Birth: 05-Jun-1991           MRN: [id number] Visit Date: 01/31/2024 PCP: Joyce Norleen BROCKS, MD   Assessment & Plan:  Chief Complaint:  Chief Complaint  Patient presents with   Right Knee - Follow-up, Routine Post Op    01/03/2024 right knee ACL recon   Visit Diagnoses:  1. S/P ACL reconstruction     Plan: Patient is a 33 year old male who presents s/p right knee anterior cruciate ligament reconstruction using bone patellar tendon bone allograft and augmentation with B MAC from iliac crest along with meniscal repair on 01/03/2024.  He also had MCL reconstruction at the time of surgery.  Had continued to be nonweightbearing since surgery.  Has been using CPM machine up to 130 degrees.  Wearing a hinged knee brace.  Ambulating with crutches.  Still having some medial sided pain.  Taking aspirin  for DVT prophylaxis.  Is taking less and less opioid pain medication.  On exam, patient has 0 degrees extension and about 105 degrees of knee flexion passively.  He is able to perform straight leg raise without extensor lag.  Excellent quad strength.  Small effusion is present.  ACL graft is stable on Lachman exam.  MCL is stable to valgus stressing both at 0 and 30 degrees with excellent endpoint at 0 degrees and minimal laxity at 30 degrees.  Incisions are healing well without evidence of infection or dehiscence.  Plan at this time is transition to weightbearing as tolerated with crutches for balance.  He will use these for the next few days as he weans off crutches.  Plan to start physical therapy upstairs in order to optimize his gait, knee range of motion, quad strength.  Okay for close chain but not open chain quadricep strengthening exercises.  No loaded knee flexion past 90 degrees   plan to return in 4 weeks for clinical recheck with Dr. Addie and consideration of return to work at that time.  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  Did caution him against any athletic activities such as running/jumping/athletics/squatting.  Follow-Up Instructions: No follow-ups on file.   Orders:  No orders of the defined types were placed in this encounter.  No orders of the defined types were placed in this encounter.   Imaging: No results found.  PMFS History: Patient Active Problem List   Diagnosis Date Noted   Attention deficit hyperactivity disorder (ADHD) 06/22/2023   History of hernia surgery 01/15/2021   Drug abuse (HCC) 01/15/2021   History of hepatitis B 06/10/2020   Current smoker 07/30/2015   Hydrocele in adult 07/30/2015   Past Medical History:  Diagnosis Date   ADHD (attention deficit hyperactivity disorder)    Allergy    Headache(784.0)    Hepatitis C    per pt cleared on its own   MRSA infection (methicillin-resistant Staphylococcus aureus)    has had 3 times in past   Polysubstance abuse (HCC)    BZO, ETOH, marijuana, opioids    Family History  Problem Relation Age of Onset   Healthy Mother    Hyperlipidemia Father    Alcoholism Father     Past Surgical History:  Procedure Laterality Date   FOREIGN BODY REMOVAL Right 10/06/2013   Procedure: FOREIGN BODY REMOVAL ADULT right index finger;  Surgeon: Donnice DELENA Robinsons,  MD;  Location: [facility];  Service: Orthopedics;  Laterality: Right;   KNEE ARTHROSCOPY Right    KNEE SURGERY Right 07/06/2015   MYRINGOTOMY     XI ROBOTIC ASSISTED INGUINAL HERNIA REPAIR WITH MESH Right 01/02/2021   Procedure: XI ROBOTIC ASSISTED INGUINAL HERNIA REPAIR WITH MESH;  Surgeon: Desiderio Schanz, MD;  Location: [facility];  Service: General;  Laterality: Right;   Social History   Occupational History   Not on file  Tobacco Use   Smoking status: Every Day    Current packs/day: 0.25    Types: Cigarettes   Smokeless tobacco: Never  Vaping Use   Vaping status: Every Day  Substance and Sexual Activity   Alcohol use: Not  Currently    Alcohol/week: 14.0 standard drinks of alcohol    Types: 14 Cans of beer per week   Drug use: Not Currently    Types: IV, Marijuana    Comment: Heroin with Fentanyl    Sexual activity: Yes

## 2024-02-04 NOTE — Therapy (Signed)
 OUTPATIENT PHYSICAL THERAPY LOWER EXTREMITY EVALUATION   Patient Name: Bob Vargas MRN: [id number] DOB:02-Jan-1991, 33 y.o., male Today's Date: 02/07/2024  END OF SESSION:  PT End of Session - 02/07/24 1428     Visit Number 1    Number of Visits 24    Date for PT Re-Evaluation 05/01/24    Authorization Type BCBS 30 VL, $50 COPAY, AUTH NEEDED    Progress Note Due on Visit 10    PT Start Time 1431    PT Stop Time 1520    PT Time Calculation (min) 49 min    Activity Tolerance Patient tolerated treatment well    Behavior During Therapy WFL for tasks assessed/performed          Past Medical History:  Diagnosis Date   ADHD (attention deficit hyperactivity disorder)    Allergy    Headache(784.0)    Hepatitis C    per pt cleared on its own   MRSA infection (methicillin-resistant Staphylococcus aureus)    has had 3 times in past   Polysubstance abuse (HCC)    BZO, ETOH, marijuana, opioids   Past Surgical History:  Procedure Laterality Date   FOREIGN BODY REMOVAL Right 10/06/2013   Procedure: FOREIGN BODY REMOVAL ADULT right index finger;  Surgeon: Donnice DELENA Robinsons, MD;  Location: [facility];  Service: Orthopedics;  Laterality: Right;   KNEE ARTHROSCOPY Right    KNEE SURGERY Right 07/06/2015   MYRINGOTOMY     XI ROBOTIC ASSISTED INGUINAL HERNIA REPAIR WITH MESH Right 01/02/2021   Procedure: XI ROBOTIC ASSISTED INGUINAL HERNIA REPAIR WITH MESH;  Surgeon: Desiderio Schanz, MD;  Location: [facility];  Service: General;  Laterality: Right;   Patient Active Problem List   Diagnosis Date Noted   Attention deficit hyperactivity disorder (ADHD) 06/22/2023   History of hernia surgery 01/15/2021   Drug abuse (HCC) 01/15/2021   History of hepatitis B 06/10/2020   Current smoker 07/30/2015   Hydrocele in adult 07/30/2015    PCP: Norleen JAYSON Jobs, MD   REFERRING PROVIDER: Carlin CROME Magnant, PA-C  REFERRING DIAG: [phone] (ICD-10-CM) - S/P ACL  reconstruction  THERAPY DIAG:  Chronic pain of right knee  Stiffness of right knee, not elsewhere classified  Localized edema  Other abnormalities of gait and mobility  Muscle weakness (generalized)  Rationale for Evaluation and Treatment: Rehabilitation  ONSET DATE: 01/03/2024  SUBJECTIVE:   SUBJECTIVE STATEMENT: It feels like something unhinged with my leg when thinking about the ACL/MCL and my meniscus has a constant dull pain and feels like it is still torn.   PERTINENT HISTORY: Patient reports injury that led to surgery occurred when going up stairs while carrying stuff and twisted his knee leading to a fall on Oct 31, 2023. Patient has had one follow up with next appointment occurring on February 25, 2024 where they will discuss return to work. Patient has experienced two other surgeries on Rt knee all focused on meniscus, one arthroscopic surgery on Lt knee, and has broken his Rt ankle twice in the past. PAIN:  Are you having pain? Yes: NPRS scale: 2-3/10 at rest, 4-5/10 at worst ; most pain occurs upon waking up in the morning  Pain location: medial joint line  Pain description: constant dull ache  Aggravating factors: standing on it Relieving factors: lying down, tylenol /aspirin /caffeine combo, NSAID, muscle relaxer before bed  PRECAUTIONS: Knee  RED FLAGS: None   WEIGHT BEARING RESTRICTIONS: WBAT transitioning to without crutch  FALLS:  Has patient fallen  in last 6 months? Yes. Number of falls 1 that lead to injury  LIVING ENVIRONMENT: Lives with: lives with their family Lives in: House/apartment Stairs: No Has following equipment at home: Crutches  OCCUPATION: Psychologist, occupational   PLOF: Independent  PATIENT GOALS: get leg in a healthy spot where I can work again,  not be hurting   NEXT MD VISIT: Feb 25, 2024  OBJECTIVE:  Note: Objective measures were completed at Evaluation unless otherwise noted.  DIAGNOSTIC FINDINGS:   PATIENT SURVEYS:  PSFS: THE  PATIENT SPECIFIC FUNCTIONAL SCALE  Place score of 0-10 (0 = unable to perform activity and 10 = able to perform activity at the same level as before injury or problem)  Activity Date: 02/07/2024    Walking regularly   5    2.  Working   0    3.     4.      Total Score 2.5      Total Score = Sum of activity scores/number of activities  Minimally Detectable Change: 3 points (for single activity); 2 points (for average score)  [facility] (nd). The Patient Specific Functional Scale . Retrieved from [email or web address]   COGNITION: Overall cognitive status: Within functional limits for tasks assessed     SENSATION: Light touch: WFL; subjective numbness over incision area  EDEMA:  Circumferential: 40cm on Rt, 38 cm on Lt   MUSCLE LENGTH: Not assessed on eval  POSTURE: rounded shoulders and increased thoracic kyphosis  PALPATION: No increased tenderness with palpation; visualized areas intact with appropriate scar healing  LOWER EXTREMITY ROM:  ROM Right eval Left eval  Hip flexion 4+/5 5/5  Hip extension 4+/5 4+/5  Hip abduction 5/5 5/5  Hip adduction    Hip internal rotation    Hip external rotation    Knee flexion 3+/5 5/5  Knee extension 3+/5 5/5  Ankle dorsiflexion 3+/5 5/5  Ankle plantarflexion    Ankle inversion    Ankle eversion     (Blank rows = not tested)  LOWER EXTREMITY MMT:  MMT Right eval Left eval  Hip flexion    Hip extension    Hip abduction    Hip adduction    Hip internal rotation    Hip external rotation    Knee flexion 127   Knee extension 3deg flexion   Ankle dorsiflexion    Ankle plantarflexion    Ankle inversion    Ankle eversion     (Blank rows = not tested)  LOWER EXTREMITY SPECIAL TESTS:  None assessed on eval  FUNCTIONAL TESTS:  None assessed on eval   GAIT: Distance walked: not formally assessed  Assistive device utilized: assessed gait with single  crutch and without crutch Level of assistance: Modified independence and supervision  Comments: antalgic gait pattern, increased abduction with Rt LE during swing and stance ; further investigation required  TREATMENT DATE:  02/07/2024 TherEx:  HEP handout provided with patient performing one set of each exercise for appropriate form. Verbal and tactile cues provided.   Gait Training  Assessed gait with single crutch and without crutch. PT giving verbal cues for placing crutch on Lt side using 2-point technique and for appropriate gait mechanics throughout stance. PT educating on importance of using single crutch for long distances, with days with increased pain, and when feeling off-balance. PT also discussing with patient about weaning off crutch with appropriate form.    PATIENT EDUCATION:  Education details: HEP, POC, gait, pain  Person educated: Patient Education method: Explanation, Demonstration, Tactile cues, Verbal cues, and Handouts Education comprehension: verbalized understanding, returned demonstration, verbal cues required, and tactile cues required  HOME EXERCISE PROGRAM: Access Code: [email or web address] URL: [email or web address] Date: 02/07/2024 Prepared by: Susannah Daring  Exercises - Supine Bridge  - 1 x daily - 7 x weekly - 3 sets - 10 reps - 2-3 hold - Heel Raises with Counter Support  - 1 x daily - 7 x weekly - 3 sets - 10 reps - 2-3 hold - Sit to Stand Without Arm Support  - 1 x daily - 7 x weekly - 3 sets - 10 reps - Active Straight Leg Raise with Quad Set  - 1 x daily - 7 x weekly - 3 sets - 10 reps  ASSESSMENT:  CLINICAL IMPRESSION: Patient is a 33 y.o. M who was seen today for physical therapy evaluation and treatment for s/p Rt ACL construction using bone patellar tendon bone allograft and augmentation with B MAC from iliac  crest as well as meniscal repair. Patient experiencing functional mobility deficits, gait deficits, strength deficits, and pain. Patient arrived with crutch on Rt side, but PT educated on importance and reasoning behind using crutch on Lt; appropriate carryover from patient requiring intermittent reminders. Patient will benefit from skilled PT to address above noted deficits.  OBJECTIVE IMPAIRMENTS: Abnormal gait, decreased activity tolerance, decreased balance, decreased coordination, decreased knowledge of use of DME, decreased mobility, difficulty walking, decreased ROM, decreased strength, increased edema, postural dysfunction, and pain.   ACTIVITY LIMITATIONS: lifting, sitting, standing, squatting, sleeping, stairs, and transfers  PARTICIPATION LIMITATIONS: community activity and occupation  PERSONAL FACTORS: 3+ comorbidities: polysubstance abuse, hep C, headaches, and ADHD are also affecting patient's functional outcome.   REHAB POTENTIAL: Good  CLINICAL DECISION MAKING: Evolving/moderate complexity  EVALUATION COMPLEXITY: Moderate   GOALS: Goals reviewed with patient? Yes  SHORT TERM GOALS: Target date: 02/28/2024 Patient will show compliance with initial HEP. Baseline: Goal status: INITIAL  2.  Patient will report max pain levels no greater than 3/10 to improve overall quality of life. Baseline:  Goal status: INITIAL  3. Patient will increase knee extension ROM to at least neutral in order to improve overall gait mechanics.  Baseline:   Goal status: INITIAL   LONG TERM GOALS: Target date: 05/01/2024  Patient will be independent with final HEP in order to maintain and progress upon functional gains made within PT. Baseline:  Goal status: INITIAL  2.  Patient will report max pain levels no greater than 1/10 in order to improve overall quality of life. Baseline:  Goal status: INITIAL  3.  Patient will increase PSFS to at least 4.5 to show significant improvement in  subjective disability rating. Baseline:  Goal status: INITIAL  4.  Patient will increase knee extension strength to at least 4/5 to improve overall gait mechanics. Baseline:  Goal status: INITIAL  5.  Patient will increase knee flexion strength to at least 4/5 to improve overall gait mechanics. Baseline:  Goal status: INITIAL    PLAN:  PT FREQUENCY: 1-2x/week  PT DURATION: 12 weeks  PLANNED INTERVENTIONS: 97164- PT Re-evaluation, 97750- Physical Performance Testing, 97110-Therapeutic exercises, 97530- Therapeutic activity, [id number]- Neuromuscular re-education, 97535- Self Care, 02859- Manual therapy, [id number]- Gait training, [phone]- Electrical stimulation (unattended), [id number]- Electrical stimulation (manual), [id number]- Vasopneumatic device, [id number]- Ultrasound, [id number]- Traction (mechanical), [id number]- Ionotophoresis 4mg /ml Dexamethasone , 20560 (1-2 muscles), 20561 (3+ muscles)- Dry Needling, Patient/Family education, Balance training, Stair training, Taping, Joint mobilization, Spinal manipulation, Spinal mobilization, Scar mobilization, Vestibular training, DME instructions, Cryotherapy, and Moist heat  PLAN FOR NEXT SESSION: reassess HEP, ROM, CKC strengthening   *ACL/meniscus repair protocolDEWAINE Susannah Daring, PT, DPT 02/07/24 4:18 PM

## 2024-02-07 ENCOUNTER — Ambulatory Visit (INDEPENDENT_AMBULATORY_CARE_PROVIDER_SITE_OTHER): Payer: Self-pay

## 2024-02-07 DIAGNOSIS — M25661 Stiffness of right knee, not elsewhere classified: Secondary | ICD-10-CM | POA: Diagnosis not present

## 2024-02-07 DIAGNOSIS — M25561 Pain in right knee: Secondary | ICD-10-CM | POA: Diagnosis not present

## 2024-02-07 DIAGNOSIS — G8929 Other chronic pain: Secondary | ICD-10-CM

## 2024-02-07 DIAGNOSIS — R2689 Other abnormalities of gait and mobility: Secondary | ICD-10-CM | POA: Diagnosis not present

## 2024-02-07 DIAGNOSIS — R600 Localized edema: Secondary | ICD-10-CM

## 2024-02-07 DIAGNOSIS — M6281 Muscle weakness (generalized): Secondary | ICD-10-CM

## 2024-02-16 ENCOUNTER — Ambulatory Visit: Admitting: Rehabilitative and Restorative Service Providers"

## 2024-02-16 ENCOUNTER — Encounter: Payer: Self-pay | Admitting: Rehabilitative and Restorative Service Providers"

## 2024-02-16 DIAGNOSIS — R2689 Other abnormalities of gait and mobility: Secondary | ICD-10-CM

## 2024-02-16 DIAGNOSIS — R600 Localized edema: Secondary | ICD-10-CM | POA: Diagnosis not present

## 2024-02-16 DIAGNOSIS — G8929 Other chronic pain: Secondary | ICD-10-CM

## 2024-02-16 DIAGNOSIS — M6281 Muscle weakness (generalized): Secondary | ICD-10-CM

## 2024-02-16 DIAGNOSIS — M25561 Pain in right knee: Secondary | ICD-10-CM

## 2024-02-16 DIAGNOSIS — M25661 Stiffness of right knee, not elsewhere classified: Secondary | ICD-10-CM

## 2024-02-16 NOTE — Therapy (Signed)
 OUTPATIENT PHYSICAL THERAPY TREATMENT   Patient Name: Bob Vargas MRN: [id number] DOB:12-Feb-1991, 33 y.o., male Today's Date: 02/16/2024  END OF SESSION:  PT End of Session - 02/16/24 1429     Visit Number 2    Number of Visits 24    Date for PT Re-Evaluation 05/01/24    Authorization Type BCBS 30 VL, $50 COPAY, AUTH NEEDED    Authorization - Visit Number 2    Authorization - Number of Visits 10    Progress Note Due on Visit 10    PT Start Time 1420    PT Stop Time 1515    PT Time Calculation (min) 55 min    Activity Tolerance Patient tolerated treatment well    Behavior During Therapy WFL for tasks assessed/performed           Past Medical History:  Diagnosis Date   ADHD (attention deficit hyperactivity disorder)    Allergy    Headache(784.0)    Hepatitis C    per pt cleared on its own   MRSA infection (methicillin-resistant Staphylococcus aureus)    has had 3 times in past   Polysubstance abuse (HCC)    BZO, ETOH, marijuana, opioids   Past Surgical History:  Procedure Laterality Date   FOREIGN BODY REMOVAL Right 10/06/2013   Procedure: FOREIGN BODY REMOVAL ADULT right index finger;  Surgeon: Donnice DELENA Robinsons, MD;  Location: [facility];  Service: Orthopedics;  Laterality: Right;   KNEE ARTHROSCOPY Right    KNEE SURGERY Right 07/06/2015   MYRINGOTOMY     XI ROBOTIC ASSISTED INGUINAL HERNIA REPAIR WITH MESH Right 01/02/2021   Procedure: XI ROBOTIC ASSISTED INGUINAL HERNIA REPAIR WITH MESH;  Surgeon: Desiderio Schanz, MD;  Location: [facility];  Service: General;  Laterality: Right;   Patient Active Problem List   Diagnosis Date Noted   Attention deficit hyperactivity disorder (ADHD) 06/22/2023   History of hernia surgery 01/15/2021   Drug abuse (HCC) 01/15/2021   History of hepatitis B 06/10/2020   Current smoker 07/30/2015   Hydrocele in adult 07/30/2015    PCP: Norleen JAYSON Jobs, MD   REFERRING PROVIDER: Carlin CROME Magnant,  PA-C  REFERRING DIAG: [phone] (ICD-10-CM) - S/P ACL reconstruction  THERAPY DIAG:  Chronic pain of right knee  Stiffness of right knee, not elsewhere classified  Localized edema  Other abnormalities of gait and mobility  Muscle weakness (generalized)  Rationale for Evaluation and Treatment: Rehabilitation  ONSET DATE: 01/03/2024  SUBJECTIVE:   SUBJECTIVE STATEMENT: Pt indicated feeling swelling after being up all day.  Pt indicated medial knee joint is where the pain is noted when it does occur.  Reported   PERTINENT HISTORY: Patient reports injury that led to surgery occurred when going up stairs while carrying stuff and twisted his knee leading to a fall on Oct 31, 2023. Patient has had one follow up with next appointment occurring on February 25, 2024 where they will discuss return to work. Patient has experienced two other surgeries on Rt knee all focused on meniscus, one arthroscopic surgery on Lt knee, and has broken his Rt ankle twice in the past.  PAIN:  NPRS scale:  3-4/10 Pain location: medial joint line  Pain description: constant dull ache  Aggravating factors: standing on it Relieving factors: lying down, tylenol /aspirin /caffeine combo, NSAID, muscle relaxer before bed  PRECAUTIONS: Knee ACL with patellar bone allograft with B MAC from iliac crest with meniscal repair.   RED FLAGS: None   WEIGHT BEARING RESTRICTIONS:  WBAT transitioning to without crutch  FALLS:  Has patient fallen in last 6 months? Yes. Number of falls 1 that lead to injury  LIVING ENVIRONMENT: Lives with: lives with their family Lives in: House/apartment Stairs: No Has following equipment at home: Crutches  OCCUPATION: Psychologist, occupational   PLOF: Independent  PATIENT GOALS: get leg in a healthy spot where I can work again,  not be hurting   NEXT MD VISIT: Feb 25, 2024  OBJECTIVE:  Note: Objective measures were completed at Evaluation unless otherwise noted.  DIAGNOSTIC FINDINGS:    PATIENT SURVEYS:  PSFS: THE PATIENT SPECIFIC FUNCTIONAL SCALE  Place score of 0-10 (0 = unable to perform activity and 10 = able to perform activity at the same level as before injury or problem)  Activity Date: 02/07/2024    Walking regularly   5    2.  Working   0    3.     4.      Total Score 2.5      Total Score = Sum of activity scores/number of activities  Minimally Detectable Change: 3 points (for single activity); 2 points (for average score)  [facility] (nd). The Patient Specific Functional Scale . Retrieved from [email or web address]   COGNITION: 02/07/2024 Overall cognitive status: Within functional limits for tasks assessed     SENSATION: 02/07/2024 Light touch: WFL; subjective numbness over incision area  EDEMA:  02/07/2024 Circumferential: 40cm on Rt, 38 cm on Lt   MUSCLE LENGTH: 02/07/2024 Not assessed on eval  POSTURE:  02/07/2024 rounded shoulders and increased thoracic kyphosis  PALPATION: 02/07/2024 No increased tenderness with palpation; visualized areas intact with appropriate scar healing  LOWER EXTREMITY ROM:  ROM Right Eval 02/07/2024 Left Eval 02/07/2024  Hip flexion 4+/5 5/5  Hip extension 4+/5 4+/5  Hip abduction 5/5 5/5  Hip adduction    Hip internal rotation    Hip external rotation    Knee flexion 3+/5 5/5  Knee extension 3+/5 5/5  Ankle dorsiflexion 3+/5 5/5  Ankle plantarflexion    Ankle inversion    Ankle eversion     (Blank rows = not tested)  LOWER EXTREMITY MMT:  MMT Right Eval 02/07/2024 Left eval  Hip flexion    Hip extension    Hip abduction    Hip adduction    Hip internal rotation    Hip external rotation    Knee flexion 127   Knee extension 3deg flexion   Ankle dorsiflexion    Ankle plantarflexion    Ankle inversion    Ankle eversion     (Blank rows = not tested)  LOWER EXTREMITY SPECIAL TESTS:  02/07/2024 None assessed on  eval  FUNCTIONAL TESTS:  02/07/2024 None assessed on eval   GAIT: 02/16/2024: Independent ambulation without crutches.   02/07/2024 Distance walked: not formally assessed  Assistive device utilized: assessed gait with single crutch and without crutch Level of assistance: Modified independence and supervision  Comments: antalgic gait pattern, increased abduction with Rt LE during swing and stance ; further investigation required  TREATMENT          DATE: 02/16/2024 Therex: Recumbent bike seat 6 for ROM 8 mins Seated Rt leg LAQ with end range pauses x 15 Supine bridge 2 x 10 2 sec hold Supine Rt knee flexion AROM heel slide 5 sec hold combo with SLR x 15 each Supine Rt leg LAQ in elevation with hip in 90 deg 2 x 10   Verbal review of HEP.   Neuro Re-ed: Tandem stance 1 min x 1 bilateral on ground , 1 min x 1 bilateral on foam SLS on black mat with contralateral leg tapping corners x 8 each corner  Self Care Education and review of edema reduction techniques including muscle pump exercise, gravity elevation, and icing.    Vaso  Rt leg 10 mins 34 degrees, medium compression in elevation.  Education performed during vaso time.       TREATMENT          DATE: 02/07/2024 TherEx:  HEP handout provided with patient performing one set of each exercise for appropriate form. Verbal and tactile cues provided.   Gait Training  Assessed gait with single crutch and without crutch. PT giving verbal cues for placing crutch on Lt side using 2-point technique and for appropriate gait mechanics throughout stance. PT educating on importance of using single crutch for long distances, with days with increased pain, and when feeling off-balance. PT also discussing with patient about weaning off crutch with appropriate form.    PATIENT EDUCATION:  Education  details: HEP, POC, gait, pain  Person educated: Patient Education method: Explanation, Demonstration, Tactile cues, Verbal cues, and Handouts Education comprehension: verbalized understanding, returned demonstration, verbal cues required, and tactile cues required  HOME EXERCISE PROGRAM: Access Code: [email or web address] URL: [email or web address] Date: 02/16/2024 Prepared by: Ozell Silvan  Exercises - Supine Bridge  - 1 x daily - 7 x weekly - 2-3 sets - 10 reps - 2-3 hold - Heel Raises with Counter Support  - 1 x daily - 7 x weekly - 3 sets - 10 reps - 2-3 hold - Sit to Stand Without Arm Support  - 1 x daily - 7 x weekly - 3 sets - 10 reps - Supine Heel Slide  - 2-3 x daily - 7 x weekly - 1 sets - 10 reps - 2 hold - Active Straight Leg Raise with Quad Set  - 2-3 x daily - 7 x weekly - 1 sets - 10 reps - Supine 90/90 Sciatic Nerve Glide with Knee Flexion/Extension  - 1-2 x daily - 7 x weekly - 1-2 sets - 10 reps - Seated SLR  - 1-2 x daily - 7 x weekly - 1-2 sets - 10-15 reps - 2 hold  ASSESSMENT:  CLINICAL IMPRESSION: Ambulated with independent gait with brace on knee, no crutches.   Pt did fairly well overall in new activity within clinic.  Pt indicated having some complaints with stiffness in knee with prolonged standing.  Progressed HEP for further strengthening.  Detailed importance of adherence to POC as able financial to make gains and improvements as needed to reach goals and return to work safely.  Pt voiced concerns about cost of visits.     OBJECTIVE IMPAIRMENTS: Abnormal gait, decreased activity tolerance, decreased balance, decreased coordination, decreased knowledge of use of DME, decreased mobility, difficulty walking, decreased ROM, decreased strength, increased edema, postural dysfunction, and pain.   ACTIVITY LIMITATIONS: lifting, sitting, standing, squatting, sleeping, stairs, and transfers  PARTICIPATION LIMITATIONS: community activity and occupation  PERSONAL  FACTORS: 3+ comorbidities: polysubstance abuse, hep C, headaches, and ADHD are also affecting patient's functional outcome.   REHAB POTENTIAL: Good  CLINICAL DECISION MAKING: Evolving/moderate complexity  EVALUATION COMPLEXITY: Moderate   GOALS: Goals reviewed with patient? Yes  SHORT TERM GOALS: Target date: 02/28/2024 Patient will show compliance with initial HEP. Baseline: Goal status: on going 02/16/2024  2.  Patient will report max pain levels no greater than 3/10 to improve overall quality of life. Baseline:  Goal status: on going 02/16/2024  3. Patient will increase knee extension ROM to at least neutral in order to improve overall gait mechanics.  Baseline:   Goal status: on going 02/16/2024   LONG TERM GOALS: Target date: 05/01/2024  Patient will be independent with final HEP in order to maintain and progress upon functional gains made within PT. Baseline:  Goal status: INITIAL  2.  Patient will report max pain levels no greater than 1/10 in order to improve overall quality of life. Baseline:  Goal status: INITIAL  3.  Patient will increase PSFS to at least 4.5 to show significant improvement in subjective disability rating. Baseline:  Goal status: INITIAL  4.  Patient will increase knee extension strength to at least 4/5 to improve overall gait mechanics. Baseline:  Goal status: INITIAL  5.  Patient will increase knee flexion strength to at least 4/5 to improve overall gait mechanics. Baseline:  Goal status: INITIAL    PLAN:  PT FREQUENCY: 1-2x/week  PT DURATION: 12 weeks  PLANNED INTERVENTIONS: 97164- PT Re-evaluation, 97750- Physical Performance Testing, 97110-Therapeutic exercises, 97530- Therapeutic activity, [id number]- Neuromuscular re-education, 97535- Self Care, 02859- Manual therapy, [id number]- Gait training, [phone]- Electrical stimulation (unattended), [phone]- Electrical stimulation (manual), [id number]- Vasopneumatic device, [id number]- Ultrasound, [id number]- Traction  (mechanical), [id number]- Ionotophoresis 4mg /ml Dexamethasone , 79439 (1-2 muscles), 20561 (3+ muscles)- Dry Needling, Patient/Family education, Balance training, Stair training, Taping, Joint mobilization, Spinal manipulation, Spinal mobilization, Scar mobilization, Vestibular training, DME instructions, Cryotherapy, and Moist heat  PLAN FOR NEXT SESSION: Progressive strengthening gains in CKC < 90 deg.   *ACL/meniscus repair protocol*  PA note on 01/31/2024: Okay for close chain but not open chain quadricep strengthening exercises. No loaded knee flexion past 90 degrees    Ozell Silvan, PT, DPT, OCS, ATC 02/16/24  3:19 PM

## 2024-02-20 NOTE — Therapy (Incomplete)
 OUTPATIENT PHYSICAL THERAPY TREATMENT   Patient Name: Bob Vargas MRN: [id number] DOB:09-08-1990, 33 y.o., male Today's Date: 02/20/2024  END OF SESSION:***     Past Medical History:  Diagnosis Date   ADHD (attention deficit hyperactivity disorder)    Allergy    Headache(784.0)    Hepatitis C    per pt cleared on its own   MRSA infection (methicillin-resistant Staphylococcus aureus)    has had 3 times in past   Polysubstance abuse (HCC)    BZO, ETOH, marijuana, opioids   Past Surgical History:  Procedure Laterality Date   FOREIGN BODY REMOVAL Right 10/06/2013   Procedure: FOREIGN BODY REMOVAL ADULT right index finger;  Surgeon: Donnice DELENA Robinsons, MD;  Location: [facility];  Service: Orthopedics;  Laterality: Right;   KNEE ARTHROSCOPY Right    KNEE SURGERY Right 07/06/2015   MYRINGOTOMY     XI ROBOTIC ASSISTED INGUINAL HERNIA REPAIR WITH MESH Right 01/02/2021   Procedure: XI ROBOTIC ASSISTED INGUINAL HERNIA REPAIR WITH MESH;  Surgeon: Desiderio Schanz, MD;  Location: [facility];  Service: General;  Laterality: Right;   Patient Active Problem List   Diagnosis Date Noted   Attention deficit hyperactivity disorder (ADHD) 06/22/2023   History of hernia surgery 01/15/2021   Drug abuse (HCC) 01/15/2021   History of hepatitis B 06/10/2020   Current smoker 07/30/2015   Hydrocele in adult 07/30/2015    PCP: Norleen JAYSON Jobs, MD   REFERRING PROVIDER: Carlin LITTIE Calix, PA-C  REFERRING DIAG: [phone] (ICD-10-CM) - S/P ACL reconstruction  THERAPY DIAG:  No diagnosis found.  Rationale for Evaluation and Treatment: Rehabilitation  ONSET DATE: 01/03/2024  SUBJECTIVE:   SUBJECTIVE STATEMENT: ***Pt indicated feeling swelling after being up all day.  Pt indicated medial knee joint is where the pain is noted when it does occur.  Reported   PERTINENT HISTORY: Patient reports injury that led to surgery occurred when going up stairs while carrying stuff and  twisted his knee leading to a fall on Oct 31, 2023. Patient has had one follow up with next appointment occurring on February 25, 2024 where they will discuss return to work. Patient has experienced two other surgeries on Rt knee all focused on meniscus, one arthroscopic surgery on Lt knee, and has broken his Rt ankle twice in the past.  PAIN:  ***NPRS scale:  3-4/10 Pain location: medial joint line  Pain description: constant dull ache  Aggravating factors: standing on it Relieving factors: lying down, tylenol /aspirin /caffeine combo, NSAID, muscle relaxer before bed  PRECAUTIONS: Knee ACL with patellar bone allograft with B MAC from iliac crest with meniscal repair.   RED FLAGS: None   WEIGHT BEARING RESTRICTIONS: WBAT transitioning to without crutch  FALLS:  Has patient fallen in last 6 months? Yes. Number of falls 1 that lead to injury  LIVING ENVIRONMENT: Lives with: lives with their family Lives in: House/apartment Stairs: No Has following equipment at home: Crutches  OCCUPATION: Psychologist, occupational   PLOF: Independent  PATIENT GOALS: get leg in a healthy spot where I can work again,  not be hurting   NEXT MD VISIT: Feb 25, 2024  OBJECTIVE:  Note: Objective measures were completed at Evaluation unless otherwise noted.  DIAGNOSTIC FINDINGS:   PATIENT SURVEYS:  PSFS: THE PATIENT SPECIFIC FUNCTIONAL SCALE  Place score of 0-10 (0 = unable to perform activity and 10 = able to perform activity at the same level as before injury or problem)  Activity Date: 02/07/2024    Walking regularly  5    2.  Working   0    3.     4.      Total Score 2.5      Total Score = Sum of activity scores/number of activities  Minimally Detectable Change: 3 points (for single activity); 2 points (for average score)  [facility] (nd). The Patient Specific Functional Scale . Retrieved from [email or web address]    COGNITION: 02/07/2024 Overall cognitive status: Within functional limits for tasks assessed     SENSATION: 02/07/2024 Light touch: WFL; subjective numbness over incision area  EDEMA:  02/07/2024 Circumferential: 40cm on Rt, 38 cm on Lt   MUSCLE LENGTH: 02/07/2024 Not assessed on eval  POSTURE:  02/07/2024 rounded shoulders and increased thoracic kyphosis  PALPATION: 02/07/2024 No increased tenderness with palpation; visualized areas intact with appropriate scar healing  LOWER EXTREMITY ROM:  ROM Right Eval 02/07/2024 Left Eval 02/07/2024  Hip flexion 4+/5 5/5  Hip extension 4+/5 4+/5  Hip abduction 5/5 5/5  Hip adduction    Hip internal rotation    Hip external rotation    Knee flexion 3+/5 5/5  Knee extension 3+/5 5/5  Ankle dorsiflexion 3+/5 5/5  Ankle plantarflexion    Ankle inversion    Ankle eversion     (Blank rows = not tested)  LOWER EXTREMITY MMT:  MMT Right Eval 02/07/2024 Left eval  Hip flexion    Hip extension    Hip abduction    Hip adduction    Hip internal rotation    Hip external rotation    Knee flexion 127   Knee extension 3deg flexion   Ankle dorsiflexion    Ankle plantarflexion    Ankle inversion    Ankle eversion     (Blank rows = not tested)  LOWER EXTREMITY SPECIAL TESTS:  02/07/2024 None assessed on eval  FUNCTIONAL TESTS:  02/07/2024 None assessed on eval   GAIT: 02/16/2024: Independent ambulation without crutches.   02/07/2024 Distance walked: not formally assessed  Assistive device utilized: assessed gait with single crutch and without crutch Level of assistance: Modified independence and supervision  Comments: antalgic gait pattern, increased abduction with Rt LE during swing and stance ; further investigation required                                                                                                                                                   TREATMENT           DATE:  02/21/2024*** Therex: Recumbent bike seat 6 for ROM 8 mins Seated Rt leg LAQ with end range pauses x 15 Supine bridge 2 x 10 2 sec hold Supine Rt knee flexion AROM heel slide 5 sec hold combo with SLR x 15 each Supine Rt leg LAQ in elevation with hip in 90 deg 2 x 10   Verbal review  of HEP.   Neuro Re-ed: Tandem stance 1 min x 1 bilateral on ground , 1 min x 1 bilateral on foam SLS on black mat with contralateral leg tapping corners x 8 each corner  Vaso  Rt leg 10 mins 34 degrees, medium compression in elevation.  Education performed during vaso time.     DATE: 02/16/2024 Therex: Recumbent bike seat 6 for ROM 8 mins Seated Rt leg LAQ with end range pauses x 15 Supine bridge 2 x 10 2 sec hold Supine Rt knee flexion AROM heel slide 5 sec hold combo with SLR x 15 each Supine Rt leg LAQ in elevation with hip in 90 deg 2 x 10   Verbal review of HEP.   Neuro Re-ed: Tandem stance 1 min x 1 bilateral on ground , 1 min x 1 bilateral on foam SLS on black mat with contralateral leg tapping corners x 8 each corner  Self Care Education and review of edema reduction techniques including muscle pump exercise, gravity elevation, and icing.    Vaso  Rt leg 10 mins 34 degrees, medium compression in elevation.  Education performed during vaso time.       TREATMENT          DATE: 02/07/2024 TherEx:  HEP handout provided with patient performing one set of each exercise for appropriate form. Verbal and tactile cues provided.   Gait Training  Assessed gait with single crutch and without crutch. PT giving verbal cues for placing crutch on Lt side using 2-point technique and for appropriate gait mechanics throughout stance. PT educating on importance of using single crutch for long distances, with days with increased pain, and when feeling off-balance. PT also discussing with patient about weaning off crutch with appropriate form.    PATIENT EDUCATION:  Education details: HEP, POC, gait, pain   Person educated: Patient Education method: Explanation, Demonstration, Tactile cues, Verbal cues, and Handouts Education comprehension: verbalized understanding, returned demonstration, verbal cues required, and tactile cues required  HOME EXERCISE PROGRAM: Access Code: [email or web address] URL: [email or web address] Date: 02/16/2024 Prepared by: Ozell Silvan  Exercises - Supine Bridge  - 1 x daily - 7 x weekly - 2-3 sets - 10 reps - 2-3 hold - Heel Raises with Counter Support  - 1 x daily - 7 x weekly - 3 sets - 10 reps - 2-3 hold - Sit to Stand Without Arm Support  - 1 x daily - 7 x weekly - 3 sets - 10 reps - Supine Heel Slide  - 2-3 x daily - 7 x weekly - 1 sets - 10 reps - 2 hold - Active Straight Leg Raise with Quad Set  - 2-3 x daily - 7 x weekly - 1 sets - 10 reps - Supine 90/90 Sciatic Nerve Glide with Knee Flexion/Extension  - 1-2 x daily - 7 x weekly - 1-2 sets - 10 reps - Seated SLR  - 1-2 x daily - 7 x weekly - 1-2 sets - 10-15 reps - 2 hold  ASSESSMENT:  CLINICAL IMPRESSION: ***Ambulated with independent gait with brace on knee, no crutches.   Pt did fairly well overall in new activity within clinic.  Pt indicated having some complaints with stiffness in knee with prolonged standing.  Progressed HEP for further strengthening.  Detailed importance of adherence to POC as able financial to make gains and improvements as needed to reach goals and return to work safely.  Pt voiced concerns about cost of visits.     OBJECTIVE IMPAIRMENTS:  Abnormal gait, decreased activity tolerance, decreased balance, decreased coordination, decreased knowledge of use of DME, decreased mobility, difficulty walking, decreased ROM, decreased strength, increased edema, postural dysfunction, and pain.   ACTIVITY LIMITATIONS: lifting, sitting, standing, squatting, sleeping, stairs, and transfers  PARTICIPATION LIMITATIONS: community activity and occupation  PERSONAL FACTORS: 3+ comorbidities:  polysubstance abuse, hep C, headaches, and ADHD are also affecting patient's functional outcome.   REHAB POTENTIAL: Good  CLINICAL DECISION MAKING: Evolving/moderate complexity  EVALUATION COMPLEXITY: Moderate   GOALS: Goals reviewed with patient? Yes  SHORT TERM GOALS: Target date: 02/28/2024 Patient will show compliance with initial HEP. Baseline: Goal status: on going 02/16/2024  2.  Patient will report max pain levels no greater than 3/10 to improve overall quality of life. Baseline:  Goal status: on going 02/16/2024  3. Patient will increase knee extension ROM to at least neutral in order to improve overall gait mechanics.  Baseline:   Goal status: on going 02/16/2024   LONG TERM GOALS: Target date: 05/01/2024  Patient will be independent with final HEP in order to maintain and progress upon functional gains made within PT. Baseline:  Goal status: INITIAL  2.  Patient will report max pain levels no greater than 1/10 in order to improve overall quality of life. Baseline:  Goal status: INITIAL  3.  Patient will increase PSFS to at least 4.5 to show significant improvement in subjective disability rating. Baseline:  Goal status: INITIAL  4.  Patient will increase knee extension strength to at least 4/5 to improve overall gait mechanics. Baseline:  Goal status: INITIAL  5.  Patient will increase knee flexion strength to at least 4/5 to improve overall gait mechanics. Baseline:  Goal status: INITIAL    PLAN:  PT FREQUENCY: 1-2x/week  PT DURATION: 12 weeks  PLANNED INTERVENTIONS: 97164- PT Re-evaluation, 97750- Physical Performance Testing, 97110-Therapeutic exercises, 97530- Therapeutic activity, [id number]- Neuromuscular re-education, 97535- Self Care, 02859- Manual therapy, [id number]- Gait training, [phone]- Electrical stimulation (unattended), [phone]- Electrical stimulation (manual), [id number]- Vasopneumatic device, [id number]- Ultrasound, [id number]- Traction (mechanical), [id number]-  Ionotophoresis 4mg /ml Dexamethasone , 79439 (1-2 muscles), 20561 (3+ muscles)- Dry Needling, Patient/Family education, Balance training, Stair training, Taping, Joint mobilization, Spinal manipulation, Spinal mobilization, Scar mobilization, Vestibular training, DME instructions, Cryotherapy, and Moist heat  PLAN FOR NEXT SESSION: ***Progressive strengthening gains in CKC < 90 deg.   *ACL/meniscus repair protocol*  PA note on 01/31/2024: Okay for close chain but not open chain quadricep strengthening exercises. No loaded knee flexion past 90 degrees    Burnard Meth, PT 02/20/24  3:56 PM

## 2024-02-21 ENCOUNTER — Encounter

## 2024-02-21 ENCOUNTER — Telehealth: Payer: Self-pay

## 2024-02-21 NOTE — Telephone Encounter (Signed)
 Missed 02/21/24 appointment. Called cell # , no answer, mailbox full so could not leave message.

## 2024-02-25 ENCOUNTER — Encounter: Payer: Self-pay | Admitting: Orthopedic Surgery

## 2024-02-25 ENCOUNTER — Ambulatory Visit (INDEPENDENT_AMBULATORY_CARE_PROVIDER_SITE_OTHER): Payer: Self-pay | Admitting: Orthopedic Surgery

## 2024-02-25 DIAGNOSIS — Z9889 Other specified postprocedural states: Secondary | ICD-10-CM

## 2024-02-25 NOTE — Progress Notes (Signed)
   Post-Op Visit Note   Patient: Bob Vargas           Date of Birth: [birth year]           MRN: [id number] Visit Date: 02/25/2024 PCP: Joyce Norleen BROCKS, MD   Assessment & Plan:  Chief Complaint:  Chief Complaint  Patient presents with   Right Knee - Routine Post Op     right knee anterior cruciate ligament reconstruction using bone patellar tendon bone allograft and augmentation with B MAC from iliac crest along with meniscal repair on 01/03/2024   Visit Diagnoses: No diagnosis found.  Plan: Nate is now 7 weeks out from right knee ACL reconstruction using bone patella tendon bone allograft with B MAC and meniscal repair performed 01/03/2024.  He is doing well.  On exam his got about 3 mm of laxity with good endpoint on the right compared to 2 mm on the left.  Trace effusion in that right knee.  Full range of motion.  Quad and hamstring strength excellent.  Plan at this time is let him return to work on 03/06/2024.  Hinged knee brace.  32-month return for clinical recheck.  Follow-Up Instructions: Return in about 2 months (around 04/26/2024).   Orders:  No orders of the defined types were placed in this encounter.  No orders of the defined types were placed in this encounter.   Imaging: No results found.  PMFS History: Patient Active Problem List   Diagnosis Date Noted   Attention deficit hyperactivity disorder (ADHD) 06/22/2023   History of hernia surgery 01/15/2021   Drug abuse (HCC) 01/15/2021   History of hepatitis B 06/10/2020   Current smoker 07/30/2015   Hydrocele in adult 07/30/2015   Past Medical History:  Diagnosis Date   ADHD (attention deficit hyperactivity disorder)    Allergy    Headache(784.0)    Hepatitis C    per pt cleared on its own   MRSA infection (methicillin-resistant Staphylococcus aureus)    has had 3 times in past   Polysubstance abuse (HCC)    BZO, ETOH, marijuana, opioids    Family History  Problem Relation Age of Onset   Healthy  Mother    Hyperlipidemia Father    Alcoholism Father     Past Surgical History:  Procedure Laterality Date   FOREIGN BODY REMOVAL Right 10/06/2013   Procedure: FOREIGN BODY REMOVAL ADULT right index finger;  Surgeon: Donnice DELENA Robinsons, MD;  Location: [facility];  Service: Orthopedics;  Laterality: Right;   KNEE ARTHROSCOPY Right    KNEE SURGERY Right 07/06/2015   MYRINGOTOMY     XI ROBOTIC ASSISTED INGUINAL HERNIA REPAIR WITH MESH Right 01/02/2021   Procedure: XI ROBOTIC ASSISTED INGUINAL HERNIA REPAIR WITH MESH;  Surgeon: Desiderio Schanz, MD;  Location: [facility];  Service: General;  Laterality: Right;   Social History   Occupational History   Not on file  Tobacco Use   Smoking status: Every Day    Current packs/day: 0.25    Types: Cigarettes   Smokeless tobacco: Never  Vaping Use   Vaping status: Every Day  Substance and Sexual Activity   Alcohol use: Not Currently    Alcohol/week: 14.0 standard drinks of alcohol    Types: 14 Cans of beer per week   Drug use: Not Currently    Types: IV, Marijuana    Comment: Heroin with Fentanyl    Sexual activity: Yes

## 2024-02-29 ENCOUNTER — Encounter: Payer: Self-pay | Admitting: Physical Therapy

## 2024-02-29 ENCOUNTER — Ambulatory Visit: Admitting: Physical Therapy

## 2024-02-29 ENCOUNTER — Encounter: Payer: Self-pay | Admitting: Orthopedic Surgery

## 2024-02-29 DIAGNOSIS — R2689 Other abnormalities of gait and mobility: Secondary | ICD-10-CM

## 2024-02-29 DIAGNOSIS — M25561 Pain in right knee: Secondary | ICD-10-CM | POA: Diagnosis not present

## 2024-02-29 DIAGNOSIS — M25661 Stiffness of right knee, not elsewhere classified: Secondary | ICD-10-CM | POA: Diagnosis not present

## 2024-02-29 DIAGNOSIS — R600 Localized edema: Secondary | ICD-10-CM

## 2024-02-29 DIAGNOSIS — G8929 Other chronic pain: Secondary | ICD-10-CM

## 2024-02-29 DIAGNOSIS — M6281 Muscle weakness (generalized): Secondary | ICD-10-CM

## 2024-02-29 NOTE — Therapy (Signed)
 OUTPATIENT PHYSICAL THERAPY TREATMENT   Patient Name: Bob Vargas MRN: [id number] DOB:March 06, 1991, 33 y.o., male Today's Date: 02/29/2024  END OF SESSION:  PT End of Session - 02/29/24 1055     Visit Number 3    Number of Visits 24    Date for PT Re-Evaluation 05/01/24    Authorization Type BCBS 30 VL, $50 COPAY, AUTH BELOW    Authorization Time Period 02/07/24-05/06/24    Authorization - Visit Number 3    Authorization - Number of Visits 10    Progress Note Due on Visit --    PT Start Time 1100    PT Stop Time 1143    PT Time Calculation (min) 43 min    Activity Tolerance Patient tolerated treatment well    Behavior During Therapy WFL for tasks assessed/performed            Past Medical History:  Diagnosis Date   ADHD (attention deficit hyperactivity disorder)    Allergy    Headache(784.0)    Hepatitis C    per pt cleared on its own   MRSA infection (methicillin-resistant Staphylococcus aureus)    has had 3 times in past   Polysubstance abuse (HCC)    BZO, ETOH, marijuana, opioids   Past Surgical History:  Procedure Laterality Date   FOREIGN BODY REMOVAL Right 10/06/2013   Procedure: FOREIGN BODY REMOVAL ADULT right index finger;  Surgeon: Donnice DELENA Robinsons, MD;  Location: [facility];  Service: Orthopedics;  Laterality: Right;   KNEE ARTHROSCOPY Right    KNEE SURGERY Right 07/06/2015   MYRINGOTOMY     XI ROBOTIC ASSISTED INGUINAL HERNIA REPAIR WITH MESH Right 01/02/2021   Procedure: XI ROBOTIC ASSISTED INGUINAL HERNIA REPAIR WITH MESH;  Surgeon: Desiderio Schanz, MD;  Location: [facility];  Service: General;  Laterality: Right;   Patient Active Problem List   Diagnosis Date Noted   Attention deficit hyperactivity disorder (ADHD) 06/22/2023   History of hernia surgery 01/15/2021   Drug abuse (HCC) 01/15/2021   History of hepatitis B 06/10/2020   Current smoker 07/30/2015   Hydrocele in adult 07/30/2015    PCP: Norleen JAYSON Jobs, MD    REFERRING PROVIDER: Carlin LITTIE Calix, PA-C  REFERRING DIAG: [phone] (ICD-10-CM) - S/P ACL reconstruction  THERAPY DIAG:  Chronic pain of right knee  Stiffness of right knee, not elsewhere classified  Localized edema  Other abnormalities of gait and mobility  Muscle weakness (generalized)  Rationale for Evaluation and Treatment: Rehabilitation  ONSET DATE: 01/03/2024  SUBJECTIVE:   SUBJECTIVE STATEMENT: Returning to work next week, not sure he can come back  PERTINENT HISTORY: Patient reports injury that led to surgery occurred when going up stairs while carrying stuff and twisted his knee leading to a fall on Oct 31, 2023. Patient has had one follow up with next appointment occurring on February 25, 2024 where they will discuss return to work. Patient has experienced two other surgeries on Rt knee all focused on meniscus, one arthroscopic surgery on Lt knee, and has broken his Rt ankle twice in the past.  PAIN:  NPRS scale:  no pain; c/o tightness/10 Pain location: medial joint line  Pain description: constant dull ache  Aggravating factors: standing on it Relieving factors: lying down, tylenol /aspirin /caffeine combo, NSAID, muscle relaxer before bed  PRECAUTIONS: Knee ACL with patellar bone allograft with B MAC from iliac crest with meniscal repair.   RED FLAGS: None   WEIGHT BEARING RESTRICTIONS: WBAT transitioning to without crutch  FALLS:  Has patient fallen in last 6 months? Yes. Number of falls 1 that lead to injury  LIVING ENVIRONMENT: Lives with: lives with their family Lives in: House/apartment Stairs: No Has following equipment at home: Crutches  OCCUPATION: Psychologist, occupational   PLOF: Independent  PATIENT GOALS: get leg in a healthy spot where I can work again,  not be hurting   NEXT MD VISIT: Feb 25, 2024  OBJECTIVE:  Note: Objective measures were completed at Evaluation unless otherwise noted.  DIAGNOSTIC FINDINGS:   PATIENT SURVEYS:  PSFS: THE  PATIENT SPECIFIC FUNCTIONAL SCALE  Place score of 0-10 (0 = unable to perform activity and 10 = able to perform activity at the same level as before injury or problem)  Activity Date: 02/07/2024    Walking regularly   5    2.  Working   0    3.     4.      Total Score 2.5      Total Score = Sum of activity scores/number of activities  Minimally Detectable Change: 3 points (for single activity); 2 points (for average score)  [facility] (nd). The Patient Specific Functional Scale . Retrieved from [email or web address]   COGNITION: 02/07/2024 Overall cognitive status: Within functional limits for tasks assessed     SENSATION: 02/07/2024 Light touch: WFL; subjective numbness over incision area  EDEMA:  02/07/2024 Circumferential: 40cm on Rt, 38 cm on Lt   MUSCLE LENGTH: 02/07/2024 Not assessed on eval  POSTURE:  02/07/2024 rounded shoulders and increased thoracic kyphosis  PALPATION: 02/07/2024 No increased tenderness with palpation; visualized areas intact with appropriate scar healing  LOWER EXTREMITY MMT:  MMT Right Eval 02/07/2024 Left Eval 02/07/2024  Hip flexion 4+/5 5/5  Hip extension 4+/5 4+/5  Hip abduction 5/5 5/5  Knee flexion 3+/5 5/5  Knee extension 3+/5 5/5  Ankle dorsiflexion 3+/5 5/5   (Blank rows = not tested)  LOWER EXTREMITY ROM:  ROM Right 02/07/2024 Right 02/29/24  Knee flexion 127 136  Knee extension -3 6 (hyperext)   (Blank rows = not tested)  LOWER EXTREMITY SPECIAL TESTS:  02/07/2024 None assessed on eval  FUNCTIONAL TESTS:  02/07/2024 None assessed on eval   GAIT: 02/16/2024: Independent ambulation without crutches.   02/07/2024 Distance walked: not formally assessed  Assistive device utilized: assessed gait with single crutch and without crutch Level of assistance: Modified independence and supervision  Comments: antalgic gait pattern, increased abduction with Rt LE  during swing and stance ; further investigation required                                                                                                                                                   TREATMENT           DATE:  02/29/2024 TherEx Recumbent bike L3 x 8 min ROM measurements -  see above for details  TherAct Leg Press bil 75# 3x10; then RLE only 31# 3x10 RLE SLS with LLE flexion/ext movement 5x10 reps on foam RLE calf raise 2x10; light UE support RLE single leg bridge 3x10; 5 sec hold Seated Rt SLR 2x10; 5 sec hold     02/16/2024 Therex: Recumbent bike seat 6 for ROM 8 mins Seated Rt leg LAQ with end range pauses x 15 Supine bridge 2 x 10 2 sec hold Supine Rt knee flexion AROM heel slide 5 sec hold combo with SLR x 15 each Supine Rt leg LAQ in elevation with hip in 90 deg 2 x 10   Verbal review of HEP.   Neuro Re-ed: Tandem stance 1 min x 1 bilateral on ground , 1 min x 1 bilateral on foam SLS on black mat with contralateral leg tapping corners x 8 each corner  Self Care Education and review of edema reduction techniques including muscle pump exercise, gravity elevation, and icing.    Vaso  Rt leg 10 mins 34 degrees, medium compression in elevation.  Education performed during vaso time.       02/07/2024 TherEx:  HEP handout provided with patient performing one set of each exercise for appropriate form. Verbal and tactile cues provided.   Gait Training  Assessed gait with single crutch and without crutch. PT giving verbal cues for placing crutch on Lt side using 2-point technique and for appropriate gait mechanics throughout stance. PT educating on importance of using single crutch for long distances, with days with increased pain, and when feeling off-balance. PT also discussing with patient about weaning off crutch with appropriate form.    PATIENT EDUCATION:  Education details: HEP, POC, gait, pain  Person educated: Patient Education method:  Explanation, Demonstration, Tactile cues, Verbal cues, and Handouts Education comprehension: verbalized understanding, returned demonstration, verbal cues required, and tactile cues required  HOME EXERCISE PROGRAM: Access Code: [email or web address] URL: [email or web address] Date: 02/16/2024 Prepared by: Ozell Silvan  Exercises - Supine Bridge  - 1 x daily - 7 x weekly - 2-3 sets - 10 reps - 2-3 hold - Heel Raises with Counter Support  - 1 x daily - 7 x weekly - 3 sets - 10 reps - 2-3 hold - Sit to Stand Without Arm Support  - 1 x daily - 7 x weekly - 3 sets - 10 reps - Supine Heel Slide  - 2-3 x daily - 7 x weekly - 1 sets - 10 reps - 2 hold - Active Straight Leg Raise with Quad Set  - 2-3 x daily - 7 x weekly - 1 sets - 10 reps - Supine 90/90 Sciatic Nerve Glide with Knee Flexion/Extension  - 1-2 x daily - 7 x weekly - 1-2 sets - 10 reps - Seated SLR  - 1-2 x daily - 7 x weekly - 1-2 sets - 10-15 reps - 2 hold  ASSESSMENT:  CLINICAL IMPRESSION: Excellent improvement in ROM noted today and overall doing well with PT.  He returns to work next week, working to transfer to clinic that can accommodate Saturdays.  Continue skilled PT.    OBJECTIVE IMPAIRMENTS: Abnormal gait, decreased activity tolerance, decreased balance, decreased coordination, decreased knowledge of use of DME, decreased mobility, difficulty walking, decreased ROM, decreased strength, increased edema, postural dysfunction, and pain.   ACTIVITY LIMITATIONS: lifting, sitting, standing, squatting, sleeping, stairs, and transfers  PARTICIPATION LIMITATIONS: community activity and occupation  PERSONAL FACTORS: 3+ comorbidities: polysubstance abuse, hep C, headaches, and ADHD are also  affecting patient's functional outcome.   REHAB POTENTIAL: Good  CLINICAL DECISION MAKING: Evolving/moderate complexity  EVALUATION COMPLEXITY: Moderate   GOALS: Goals reviewed with patient? Yes  SHORT TERM GOALS: Target date:  02/28/2024 Patient will show compliance with initial HEP. Baseline: Goal status: on going 02/16/2024  2.  Patient will report max pain levels no greater than 3/10 to improve overall quality of life. Baseline:  Goal status: on going 02/16/2024  3. Patient will increase knee extension ROM to at least neutral in order to improve overall gait mechanics.  Baseline:   Goal status: on going 02/16/2024   LONG TERM GOALS: Target date: 05/01/2024  Patient will be independent with final HEP in order to maintain and progress upon functional gains made within PT. Baseline:  Goal status: INITIAL  2.  Patient will report max pain levels no greater than 1/10 in order to improve overall quality of life. Baseline:  Goal status: INITIAL  3.  Patient will increase PSFS to at least 4.5 to show significant improvement in subjective disability rating. Baseline:  Goal status: INITIAL  4.  Patient will increase knee extension strength to at least 4/5 to improve overall gait mechanics. Baseline:  Goal status: INITIAL  5.  Patient will increase knee flexion strength to at least 4/5 to improve overall gait mechanics. Baseline:  Goal status: INITIAL    PLAN:  PT FREQUENCY: 1-2x/week  PT DURATION: 12 weeks  PLANNED INTERVENTIONS: [id number]- PT Re-evaluation, 97750- Physical Performance Testing, 97110-Therapeutic exercises, 97530- Therapeutic activity, [id number]- Neuromuscular re-education, 97535- Self Care, [id number]- Manual therapy, [id number]- Gait training, [phone]- Electrical stimulation (unattended), [phone]- Electrical stimulation (manual), [id number]- Vasopneumatic device, [id number]- Ultrasound, [id number]- Traction (mechanical), [id number]- Ionotophoresis 4mg /ml Dexamethasone , 79439 (1-2 muscles), 20561 (3+ muscles)- Dry Needling, Patient/Family education, Balance training, Stair training, Taping, Joint mobilization, Spinal manipulation, Spinal mobilization, Scar mobilization, Vestibular training, DME instructions, Cryotherapy, and Moist  heat  PLAN FOR NEXT SESSION: check on Saturday appts, closed chain, Progressive strengthening gains in CKC < 90 deg.   *ACL/meniscus repair protocol*  PA note on 01/31/2024: Okay for close chain but not open chain quadricep strengthening exercises. No loaded knee flexion past 90 degrees     Corean JULIANNA Ku, PT, DPT 02/29/24 11:44 AM

## 2024-03-01 NOTE — Telephone Encounter (Signed)
 Okay to return to work.  No ladders.  No lifting more than 20 pounds.  Please restrictions are temporary but should remain in effect for the next 6 weeks

## 2024-03-02 NOTE — Therapy (Addendum)
 OUTPATIENT PHYSICAL THERAPY TREATMENT/DC   Patient Name: Bob Vargas MRN: [id number] DOB:April 21, 1991, 33 y.o., male Today's Date: 03/03/2024  END OF SESSION:  PT End of Session - 03/03/24 1209     Visit Number 4    Number of Visits 24    Date for Recertification  05/01/24    Authorization Type BCBS 30 VL, $50 COPAY, AUTH BELOW    Authorization Time Period 02/07/24-05/06/24    Authorization - Number of Visits 10    Progress Note Due on Visit 10    PT Start Time 1015    PT Stop Time 1053    PT Time Calculation (min) 38 min    Activity Tolerance Patient tolerated treatment well    Behavior During Therapy WFL for tasks assessed/performed             Past Medical History:  Diagnosis Date   ADHD (attention deficit hyperactivity disorder)    Allergy    Headache(784.0)    Hepatitis C    per pt cleared on its own   MRSA infection (methicillin-resistant Staphylococcus aureus)    has had 3 times in past   Polysubstance abuse (HCC)    BZO, ETOH, marijuana, opioids   Past Surgical History:  Procedure Laterality Date   FOREIGN BODY REMOVAL Right 10/06/2013   Procedure: FOREIGN BODY REMOVAL ADULT right index finger;  Surgeon: Donnice DELENA Robinsons, MD;  Location: [facility];  Service: Orthopedics;  Laterality: Right;   KNEE ARTHROSCOPY Right    KNEE SURGERY Right 07/06/2015   MYRINGOTOMY     XI ROBOTIC ASSISTED INGUINAL HERNIA REPAIR WITH MESH Right 01/02/2021   Procedure: XI ROBOTIC ASSISTED INGUINAL HERNIA REPAIR WITH MESH;  Surgeon: Desiderio Schanz, MD;  Location: [facility];  Service: General;  Laterality: Right;   Patient Active Problem List   Diagnosis Date Noted   Attention deficit hyperactivity disorder (ADHD) 06/22/2023   History of hernia surgery 01/15/2021   Drug abuse (HCC) 01/15/2021   History of hepatitis B 06/10/2020   Current smoker 07/30/2015   Hydrocele in adult 07/30/2015    PCP: Norleen JAYSON Jobs, MD   REFERRING PROVIDER: Carlin CROME  Magnant, PA-C  REFERRING DIAG: [phone] (ICD-10-CM) - S/P ACL reconstruction  THERAPY DIAG:  Chronic pain of right knee  Stiffness of right knee, not elsewhere classified  Localized edema  Other abnormalities of gait and mobility  Muscle weakness (generalized)  Rationale for Evaluation and Treatment: Rehabilitation  ONSET DATE: 01/03/2024  SUBJECTIVE:   SUBJECTIVE STATEMENT: Pt reports some swelling but minimal to no pain in the knee.    PERTINENT HISTORY: Patient reports injury that led to surgery occurred when going up stairs while carrying stuff and twisted his knee leading to a fall on Oct 31, 2023. Patient has had one follow up with next appointment occurring on February 25, 2024 where they will discuss return to work. Patient has experienced two other surgeries on Rt knee all focused on meniscus, one arthroscopic surgery on Lt knee, and has broken his Rt ankle twice in the past.  PAIN:  NPRS scale:  no pain; c/o tightness 1/10 Pain location: medial joint line  Pain description: constant dull ache  Aggravating factors: standing on it Relieving factors: lying down, tylenol /aspirin /caffeine combo, NSAID, muscle relaxer before bed  PRECAUTIONS: Knee ACL with patellar bone allograft with B MAC from iliac crest with meniscal repair.   RED FLAGS: None   WEIGHT BEARING RESTRICTIONS: WBAT transitioning to without crutch  FALLS:  Has  patient fallen in last 6 months? Yes. Number of falls 1 that lead to injury  LIVING ENVIRONMENT: Lives with: lives with their family Lives in: House/apartment Stairs: No Has following equipment at home: Crutches  OCCUPATION: Psychologist, Occupational   PLOF: Independent  PATIENT GOALS: get leg in a healthy spot where I can work again,  not be hurting   NEXT MD VISIT: Feb 25, 2024  OBJECTIVE:  Note: Objective measures were completed at Evaluation unless otherwise noted.  DIAGNOSTIC FINDINGS:   PATIENT SURVEYS:  PSFS: THE PATIENT SPECIFIC  FUNCTIONAL SCALE  Place score of 0-10 (0 = unable to perform activity and 10 = able to perform activity at the same level as before injury or problem)  Activity Date: 02/07/2024    Walking regularly   5    2.  Working   0    3.     4.      Total Score 2.5      Total Score = Sum of activity scores/number of activities  Minimally Detectable Change: 3 points (for single activity); 2 points (for average score)  [facility] (nd). The Patient Specific Functional Scale . Retrieved from [email or web address]   COGNITION: 02/07/2024 Overall cognitive status: Within functional limits for tasks assessed     SENSATION: 02/07/2024 Light touch: WFL; subjective numbness over incision area  EDEMA:  02/07/2024 Circumferential: 40cm on Rt, 38 cm on Lt   MUSCLE LENGTH: 02/07/2024 Not assessed on eval  POSTURE:  02/07/2024 rounded shoulders and increased thoracic kyphosis  PALPATION: 02/07/2024 No increased tenderness with palpation; visualized areas intact with appropriate scar healing  LOWER EXTREMITY MMT:  MMT Right Eval 02/07/2024 Left Eval 02/07/2024  Hip flexion 4+/5 5/5  Hip extension 4+/5 4+/5  Hip abduction 5/5 5/5  Knee flexion 3+/5 5/5  Knee extension 3+/5 5/5  Ankle dorsiflexion 3+/5 5/5   (Blank rows = not tested)  LOWER EXTREMITY ROM:  ROM Right 02/07/2024 Right 02/29/24  Knee flexion 127 136  Knee extension -3 6 (hyperext)   (Blank rows = not tested)  LOWER EXTREMITY SPECIAL TESTS:  02/07/2024 None assessed on eval  FUNCTIONAL TESTS:  02/07/2024 None assessed on eval   GAIT: 02/16/2024: Independent ambulation without crutches.   02/07/2024 Distance walked: not formally assessed  Assistive device utilized: assessed gait with single crutch and without crutch Level of assistance: Modified independence and supervision  Comments: antalgic gait pattern, increased abduction with Rt LE during swing and  stance ; further investigation required                                                                                                                                                   TREATMENT           DATE:  03/03/24 TherEx Recumbent bike L3 x 8 min Clamshell with Tband resistance TherAct  Leg Press bil 87# 3x10; then RLE only 31# 3x10 RLE SLS with LLE flexion/ext movement 5x10 reps on foam Heel to toe balance EC 4x25 sec RLE calf raise 2x10; light UE support RLE single leg bridge 3x10; 5 sec hold Seated Rt SLR 2x10; 5 sec hold    02/29/2024 TherEx Recumbent bike L3 x 8 min ROM measurements - see above for details  TherAct Leg Press bil 75# 3x10; then RLE only 31# 3x10 RLE SLS with LLE flexion/ext movement 5x10 reps on foam RLE calf raise 2x10; light UE support RLE single leg bridge 3x10; 5 sec hold Seated Rt SLR 2x10; 5 sec hold     02/16/2024 Therex: Recumbent bike seat 6 for ROM 8 mins Seated Rt leg LAQ with end range pauses x 15 Supine bridge 2 x 10 2 sec hold Supine Rt knee flexion AROM heel slide 5 sec hold combo with SLR x 15 each Supine Rt leg LAQ in elevation with hip in 90 deg 2 x 10   Verbal review of HEP.   Neuro Re-ed: Tandem stance 1 min x 1 bilateral on ground , 1 min x 1 bilateral on foam SLS on black mat with contralateral leg tapping corners x 8 each corner  Self Care Education and review of edema reduction techniques including muscle pump exercise, gravity elevation, and icing.    Vaso  Rt leg 10 mins 34 degrees, medium compression in elevation.  Education performed during vaso time.       02/07/2024 TherEx:  HEP handout provided with patient performing one set of each exercise for appropriate form. Verbal and tactile cues provided.   Gait Training  Assessed gait with single crutch and without crutch. PT giving verbal cues for placing crutch on Lt side using 2-point technique and for appropriate gait mechanics throughout stance. PT  educating on importance of using single crutch for long distances, with days with increased pain, and when feeling off-balance. PT also discussing with patient about weaning off crutch with appropriate form.    PATIENT EDUCATION:  Education details: HEP, POC, gait, pain  Person educated: Patient Education method: Explanation, Demonstration, Tactile cues, Verbal cues, and Handouts Education comprehension: verbalized understanding, returned demonstration, verbal cues required, and tactile cues required  HOME EXERCISE PROGRAM: Access Code: [email or web address] URL: [email or web address] Date: 02/16/2024 Prepared by: Ozell Silvan  Exercises - Supine Bridge  - 1 x daily - 7 x weekly - 2-3 sets - 10 reps - 2-3 hold - Heel Raises with Counter Support  - 1 x daily - 7 x weekly - 3 sets - 10 reps - 2-3 hold - Sit to Stand Without Arm Support  - 1 x daily - 7 x weekly - 3 sets - 10 reps - Supine Heel Slide  - 2-3 x daily - 7 x weekly - 1 sets - 10 reps - 2 hold - Active Straight Leg Raise with Quad Set  - 2-3 x daily - 7 x weekly - 1 sets - 10 reps - Supine 90/90 Sciatic Nerve Glide with Knee Flexion/Extension  - 1-2 x daily - 7 x weekly - 1-2 sets - 10 reps - Seated SLR  - 1-2 x daily - 7 x weekly - 1-2 sets - 10-15 reps - 2 hold  ASSESSMENT:  CLINICAL IMPRESSION: Returning to work on Monday.  States work will allow a slow transition to full duty.  Needed some VC for leg raises.  Demonstrated understanding. STG met.  OBJECTIVE IMPAIRMENTS: Abnormal gait, decreased activity tolerance,  decreased balance, decreased coordination, decreased knowledge of use of DME, decreased mobility, difficulty walking, decreased ROM, decreased strength, increased edema, postural dysfunction, and pain.   ACTIVITY LIMITATIONS: lifting, sitting, standing, squatting, sleeping, stairs, and transfers  PARTICIPATION LIMITATIONS: community activity and occupation  PERSONAL FACTORS: 3+ comorbidities: polysubstance  abuse, hep C, headaches, and ADHD are also affecting patient's functional outcome.   REHAB POTENTIAL: Good  CLINICAL DECISION MAKING: Evolving/moderate complexity  EVALUATION COMPLEXITY: Moderate   GOALS: Goals reviewed with patient? Yes  SHORT TERM GOALS: Target date: 02/28/2024 Patient will show compliance with initial HEP. Baseline: Goal status: MET 03/03/24  2.  Patient will report max pain levels no greater than 3/10 to improve overall quality of life. Baseline:  Goal status: MET 03/03/24  3. Patient will increase knee extension ROM to at least neutral in order to improve overall gait mechanics.  Baseline:   Goal status: MET 03/03/24   LONG TERM GOALS: Target date: 05/01/2024  Patient will be independent with final HEP in order to maintain and progress upon functional gains made within PT. Baseline:  Goal status: INITIAL  2.  Patient will report max pain levels no greater than 1/10 in order to improve overall quality of life. Baseline:  Goal status: INITIAL  3.  Patient will increase PSFS to at least 4.5 to show significant improvement in subjective disability rating. Baseline:  Goal status: INITIAL  4.  Patient will increase knee extension strength to at least 4/5 to improve overall gait mechanics. Baseline:  Goal status: INITIAL  5.  Patient will increase knee flexion strength to at least 4/5 to improve overall gait mechanics. Baseline:  Goal status: INITIAL    PLAN:  PT FREQUENCY: 1-2x/week  PT DURATION: 12 weeks  PLANNED INTERVENTIONS: 97164- PT Re-evaluation, 97750- Physical Performance Testing, 97110-Therapeutic exercises, 97530- Therapeutic activity, [id number]- Neuromuscular re-education, 97535- Self Care, 02859- Manual therapy, [id number]- Gait training, [phone]- Electrical stimulation (unattended), [phone]- Electrical stimulation (manual), [id number]- Vasopneumatic device, [id number]- Ultrasound, [id number]- Traction (mechanical), [id number]- Ionotophoresis 4mg /ml Dexamethasone ,  79439 (1-2 muscles), 20561 (3+ muscles)- Dry Needling, Patient/Family education, Balance training, Stair training, Taping, Joint mobilization, Spinal manipulation, Spinal mobilization, Scar mobilization, Vestibular training, DME instructions, Cryotherapy, and Moist heat  PLAN FOR NEXT SESSION: closed chain, Progressive strengthening gains in CKC < 90 deg.   *ACL/meniscus repair protocol*  PA note on 01/31/2024: Okay for close chain but not open chain quadricep strengthening exercises. No loaded knee flexion past 90 degrees    PHYSICAL THERAPY DISCHARGE SUMMARY  Visits from Start of Care: 4  Current functional level related to goals / functional outcomes: See above    Remaining deficits: See Above   Education / Equipment: HJEP   Patient agrees to discharge. Patient goals were not met. Patient is being discharged due to not returning since the last visit.    Burnard Meth, PT 03/03/24  12:11 PM

## 2024-03-03 ENCOUNTER — Ambulatory Visit

## 2024-03-03 ENCOUNTER — Other Ambulatory Visit: Payer: Self-pay | Admitting: Family Medicine

## 2024-03-03 DIAGNOSIS — G8929 Other chronic pain: Secondary | ICD-10-CM

## 2024-03-03 DIAGNOSIS — M25661 Stiffness of right knee, not elsewhere classified: Secondary | ICD-10-CM

## 2024-03-03 DIAGNOSIS — R2689 Other abnormalities of gait and mobility: Secondary | ICD-10-CM | POA: Diagnosis not present

## 2024-03-03 DIAGNOSIS — M25561 Pain in right knee: Secondary | ICD-10-CM | POA: Diagnosis not present

## 2024-03-03 DIAGNOSIS — R600 Localized edema: Secondary | ICD-10-CM

## 2024-03-03 DIAGNOSIS — F909 Attention-deficit hyperactivity disorder, unspecified type: Secondary | ICD-10-CM

## 2024-03-03 DIAGNOSIS — M6281 Muscle weakness (generalized): Secondary | ICD-10-CM

## 2024-03-03 NOTE — Telephone Encounter (Signed)
 Copied from CRM [id number]. Topic: Clinical - Medication Refill >> Mar 03, 2024 11:32 AM Everette C wrote: Medication: amphetamine -dextroamphetamine  (ADDERALL) 20 MG tablet  Has the patient contacted their pharmacy? Yes (Agent: If no, request that the patient contact the pharmacy for the refill. If patient does not wish to contact the pharmacy document the reason why and proceed with request.) (Agent: If yes, when and what did the pharmacy advise?)  This is the patient's preferred pharmacy:  [id number] - [place], [place] - [address] AT [address] OF [facility] [address] [place] [address] Phone: [phone] Fax: [phone]  Is this the correct pharmacy for this prescription? Yes If no, delete pharmacy and type the correct one.   Has the prescription been filled recently? Yes  Is the patient out of the medication? Yes  Has the patient been seen for an appointment in the last year OR does the patient have an upcoming appointment? Yes  Can we respond through MyChart? No  Agent: Please be advised that Rx refills may take up to 3 business days. We ask that you follow-up with your pharmacy.

## 2024-03-04 MED ORDER — AMPHETAMINE-DEXTROAMPHETAMINE 20 MG PO TABS: 20.0000 mg | ORAL_TABLET | Freq: Two times a day (BID) | ORAL | 0 refills | Status: DC

## 2024-03-18 ENCOUNTER — Encounter

## 2024-03-25 ENCOUNTER — Ambulatory Visit

## 2024-04-10 ENCOUNTER — Telehealth: Payer: Self-pay | Admitting: Family Medicine

## 2024-04-10 DIAGNOSIS — F909 Attention-deficit hyperactivity disorder, unspecified type: Secondary | ICD-10-CM

## 2024-04-10 NOTE — Telephone Encounter (Unsigned)
 Copied from CRM [id number]. Topic: Clinical - Medication Refill >> Apr 10, 2024  3:13 PM Rachelle R wrote: Medication: amphetamine -dextroamphetamine  (ADDERALL) 20 MG tablet   Has the patient contacted their pharmacy? Yes, call dr  This is the patient's preferred pharmacy:  [id number] - [place], [place] - [address] AT [address] OF [facility] [address] [place] [address] Phone: [phone] Fax: [phone]  Is this the correct pharmacy for this prescription? Yes If no, delete pharmacy and type the correct one.   Has the prescription been filled recently? No  Is the patient out of the medication? Yes  Has the patient been seen for an appointment in the last year OR does the patient have an upcoming appointment? Yes  Can we respond through MyChart? Yes  Agent: Please be advised that Rx refills may take up to 3 business days. We ask that you follow-up with your pharmacy.

## 2024-04-11 MED ORDER — AMPHETAMINE-DEXTROAMPHETAMINE 20 MG PO TABS: 20.0000 mg | ORAL_TABLET | Freq: Two times a day (BID) | ORAL | 0 refills | Status: DC

## 2024-04-17 ENCOUNTER — Encounter: Payer: Self-pay | Admitting: Radiology

## 2024-04-28 ENCOUNTER — Encounter: Admitting: Orthopedic Surgery

## 2024-05-15 ENCOUNTER — Telehealth: Payer: Self-pay | Admitting: Family Medicine

## 2024-05-15 DIAGNOSIS — F909 Attention-deficit hyperactivity disorder, unspecified type: Secondary | ICD-10-CM

## 2024-05-15 NOTE — Telephone Encounter (Unsigned)
 Copied from CRM #[id number]. Topic: Clinical - Medication Refill >> May 15, 2024 12:25 PM Brittany M wrote: Medication: amphetamine -dextroamphetamine  (ADDERALL) 20 MG tablet  Has the patient contacted their pharmacy? Yes (Agent: If no, request that the patient contact the pharmacy for the refill. If patient does not wish to contact the pharmacy document the reason why and proceed with request.) (Agent: If yes, when and what did the pharmacy advise?)  This is the patient's preferred pharmacy:  [id number] - [place], [place] - [address] AT [address] OF [facility] [address] [place] [address] Phone: [phone] Fax: [phone]  Is this the correct pharmacy for this prescription? Yes If no, delete pharmacy and type the correct one.   Has the prescription been filled recently? Yes  Is the patient out of the medication? Yes  Has the patient been seen for an appointment in the last year OR does the patient have an upcoming appointment? Yes  Can we respond through MyChart? Yes  Agent: Please be advised that Rx refills may take up to 3 business days. We ask that you follow-up with your pharmacy.

## 2024-05-16 ENCOUNTER — Telehealth: Payer: Self-pay

## 2024-05-16 DIAGNOSIS — F909 Attention-deficit hyperactivity disorder, unspecified type: Secondary | ICD-10-CM

## 2024-05-16 MED ORDER — AMPHETAMINE-DEXTROAMPHETAMINE 20 MG PO TABS: 20.0000 mg | ORAL_TABLET | Freq: Two times a day (BID) | ORAL | 0 refills | Status: DC

## 2024-05-16 NOTE — Telephone Encounter (Signed)
 Copied from CRM #[id number]. Topic: Clinical - Prescription Issue >> May 16, 2024 10:05 AM Berwyn MATSU wrote: Reason for CRM: patient called in to check status of medication refill for ADDERALL 20 MG tablet I advised currently pending. Per patient he is completely out of medication.   May you please advise.

## 2024-05-16 NOTE — Addendum Note (Signed)
 Addended by: JOYCE NORLEEN BROCKS on: 05/16/2024 01:11 PM   Modules accepted: Orders

## 2024-06-12 ENCOUNTER — Telehealth: Payer: Self-pay

## 2024-06-12 ENCOUNTER — Other Ambulatory Visit: Payer: Self-pay | Admitting: Family Medicine

## 2024-06-12 DIAGNOSIS — F909 Attention-deficit hyperactivity disorder, unspecified type: Secondary | ICD-10-CM

## 2024-06-12 MED ORDER — AMPHETAMINE-DEXTROAMPHETAMINE 20 MG PO TABS: 20.0000 mg | ORAL_TABLET | Freq: Two times a day (BID) | ORAL | 0 refills | Status: DC

## 2024-06-12 NOTE — Telephone Encounter (Signed)
 Patient has appt scheduled for 06/21/24  Copied from CRM #[id number]. Topic: Clinical - Prescription Issue >> Jun 12, 2024  3:56 PM Wess RAMAN wrote: Reason for CRM: Patient would like to know if his prescription will be filled for amphetamine -dextroamphetamine  (ADDERALL) 20 MG tablet   Callback #: [phone]  Pharmacy: [id number] - [place], [place] - [address] AT [address] OF [facility] [address] [place]  [address] Phone: [phone] Fax: [phone] Hours: Not open 24 hours

## 2024-06-12 NOTE — Telephone Encounter (Signed)
 Copied from CRM #[id number]. Topic: Clinical - Medication Refill >> Jun 12, 2024 11:44 AM Thersia BROCKS wrote: Medication: amphetamine -dextroamphetamine  (ADDERALL) 20 MG tablet   Has the patient contacted their pharmacy? Yes (Agent: If no, request that the patient contact the pharmacy for the refill. If patient does not wish to contact the pharmacy document the reason why and proceed with request.) (Agent: If yes, when and what did the pharmacy advise?)  This is the patient's preferred pharmacy:  [id number] - [place], [place] - [address] AT [address] OF [facility] [address] [place] [address] Phone: [phone] Fax: [phone]  Is this the correct pharmacy for this prescription? Yes If no, delete pharmacy and type the correct one.   Has the prescription been filled recently? No  Is the patient out of the medication? Yes  Has the patient been seen for an appointment in the last year OR does the patient have an upcoming appointment? Yes  Can we respond through MyChart? Yes  Agent: Please be advised that Rx refills may take up to 3 business days. We ask that you follow-up with your pharmacy.

## 2024-06-21 ENCOUNTER — Ambulatory Visit: Admitting: Family Medicine

## 2024-06-21 VITALS — BP 120/70 | HR 96 | Ht 69.0 in | Wt 207.2 lb

## 2024-06-21 DIAGNOSIS — M1990 Unspecified osteoarthritis, unspecified site: Secondary | ICD-10-CM

## 2024-06-21 DIAGNOSIS — F901 Attention-deficit hyperactivity disorder, predominantly hyperactive type: Secondary | ICD-10-CM

## 2024-06-21 DIAGNOSIS — F1911 Other psychoactive substance abuse, in remission: Secondary | ICD-10-CM | POA: Diagnosis not present

## 2024-06-21 DIAGNOSIS — F17200 Nicotine dependence, unspecified, uncomplicated: Secondary | ICD-10-CM | POA: Diagnosis not present
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
## 2024-07-18 ENCOUNTER — Telehealth: Payer: Self-pay

## 2024-07-18 DIAGNOSIS — F909 Attention-deficit hyperactivity disorder, unspecified type: Secondary | ICD-10-CM

## 2024-07-18 MED ORDER — AMPHETAMINE-DEXTROAMPHETAMINE 20 MG PO TABS: 20.0000 mg | ORAL_TABLET | Freq: Two times a day (BID) | ORAL | 0 refills | Status: AC

## 2024-07-18 NOTE — Addendum Note (Signed)
 Addended by: JOYCE NORLEEN BROCKS on: 07/18/2024 01:32 PM   Modules accepted: Orders

## 2024-11-27 ENCOUNTER — Encounter: Admitting: Family Medicine
